# Patient Record
Sex: Male | Born: 1970 | Race: Black or African American | Hispanic: No | Marital: Single | Smoking: Never smoker
Health system: Southern US, Community
[De-identification: ages and names within clinical notes are randomized; demographics above are authoritative.]

## PROBLEM LIST (undated history)

## (undated) DIAGNOSIS — E119 Type 2 diabetes mellitus without complications: Secondary | ICD-10-CM

## (undated) DIAGNOSIS — E1142 Type 2 diabetes mellitus with diabetic polyneuropathy: Secondary | ICD-10-CM

## (undated) DIAGNOSIS — E11610 Type 2 diabetes mellitus with diabetic neuropathic arthropathy: Secondary | ICD-10-CM

## (undated) DIAGNOSIS — I10 Essential (primary) hypertension: Secondary | ICD-10-CM

## (undated) DIAGNOSIS — E11319 Type 2 diabetes mellitus with unspecified diabetic retinopathy without macular edema: Secondary | ICD-10-CM

## (undated) DIAGNOSIS — M869 Osteomyelitis, unspecified: Secondary | ICD-10-CM

## (undated) HISTORY — PX: CARDIAC CATHETERIZATION: SHX172

## (undated) HISTORY — DX: Type 2 diabetes mellitus with unspecified diabetic retinopathy without macular edema: E11.319

## (undated) HISTORY — PX: OTHER SURGICAL HISTORY: SHX169

## (undated) HISTORY — DX: Osteomyelitis, unspecified: M86.9

---

## 1998-05-02 ENCOUNTER — Emergency Department (HOSPITAL_COMMUNITY): Admission: EM | Admit: 1998-05-02 | Discharge: 1998-05-02 | Payer: Self-pay | Admitting: Emergency Medicine

## 1998-10-21 ENCOUNTER — Emergency Department (HOSPITAL_COMMUNITY): Admission: EM | Admit: 1998-10-21 | Discharge: 1998-10-21 | Payer: Self-pay

## 1999-04-09 ENCOUNTER — Emergency Department (HOSPITAL_COMMUNITY): Admission: EM | Admit: 1999-04-09 | Discharge: 1999-04-09 | Payer: Self-pay | Admitting: Emergency Medicine

## 2000-03-19 ENCOUNTER — Emergency Department (HOSPITAL_COMMUNITY): Admission: EM | Admit: 2000-03-19 | Discharge: 2000-03-19 | Payer: Self-pay

## 2000-03-24 ENCOUNTER — Ambulatory Visit (HOSPITAL_COMMUNITY): Admission: RE | Admit: 2000-03-24 | Discharge: 2000-03-24 | Payer: Self-pay

## 2000-05-05 ENCOUNTER — Encounter: Admission: RE | Admit: 2000-05-05 | Discharge: 2000-08-03 | Payer: Self-pay | Admitting: Internal Medicine

## 2002-05-09 ENCOUNTER — Emergency Department (HOSPITAL_COMMUNITY): Admission: EM | Admit: 2002-05-09 | Discharge: 2002-05-09 | Payer: Self-pay | Admitting: Emergency Medicine

## 2004-08-09 ENCOUNTER — Ambulatory Visit: Payer: Self-pay | Admitting: Internal Medicine

## 2004-08-12 ENCOUNTER — Ambulatory Visit: Payer: Self-pay | Admitting: Internal Medicine

## 2004-08-22 ENCOUNTER — Ambulatory Visit: Payer: Self-pay | Admitting: Internal Medicine

## 2005-12-03 ENCOUNTER — Ambulatory Visit: Payer: Self-pay | Admitting: Internal Medicine

## 2005-12-31 ENCOUNTER — Ambulatory Visit: Payer: Self-pay | Admitting: Endocrinology

## 2006-01-21 ENCOUNTER — Ambulatory Visit: Payer: Self-pay | Admitting: Endocrinology

## 2006-11-06 DIAGNOSIS — E1169 Type 2 diabetes mellitus with other specified complication: Secondary | ICD-10-CM

## 2006-11-06 DIAGNOSIS — E669 Obesity, unspecified: Secondary | ICD-10-CM | POA: Insufficient documentation

## 2007-02-24 ENCOUNTER — Encounter: Payer: Self-pay | Admitting: Endocrinology

## 2007-02-24 DIAGNOSIS — I10 Essential (primary) hypertension: Secondary | ICD-10-CM | POA: Insufficient documentation

## 2007-07-15 ENCOUNTER — Telehealth (INDEPENDENT_AMBULATORY_CARE_PROVIDER_SITE_OTHER): Payer: Self-pay | Admitting: *Deleted

## 2007-09-08 ENCOUNTER — Ambulatory Visit: Payer: Self-pay | Admitting: Internal Medicine

## 2007-09-08 DIAGNOSIS — E8881 Metabolic syndrome: Secondary | ICD-10-CM

## 2007-09-12 LAB — CONVERTED CEMR LAB
ALT: 40 units/L (ref 0–53)
AST: 25 units/L (ref 0–37)
Albumin: 3.6 g/dL (ref 3.5–5.2)
Alkaline Phosphatase: 67 units/L (ref 39–117)
BUN: 11 mg/dL (ref 6–23)
Basophils Absolute: 0.1 10*3/uL (ref 0.0–0.1)
Basophils Relative: 0.6 % (ref 0.0–1.0)
Bilirubin, Direct: 0.1 mg/dL (ref 0.0–0.3)
CO2: 29 meq/L (ref 19–32)
Calcium: 9.5 mg/dL (ref 8.4–10.5)
Chloride: 106 meq/L (ref 96–112)
Cholesterol: 129 mg/dL (ref 0–200)
Creatinine, Ser: 1 mg/dL (ref 0.4–1.5)
Creatinine,U: 278.7 mg/dL
Eosinophils Absolute: 0.2 10*3/uL (ref 0.0–0.6)
Eosinophils Relative: 2.1 % (ref 0.0–5.0)
GFR calc Af Amer: 107 mL/min
GFR calc non Af Amer: 88 mL/min
Glucose, Bld: 139 mg/dL — ABNORMAL HIGH (ref 70–99)
HCT: 43.4 % (ref 39.0–52.0)
HDL: 20.4 mg/dL — ABNORMAL LOW (ref >39.0)
Hemoglobin: 14.5 g/dL (ref 13.0–17.0)
Hgb A1c MFr Bld: 7.4 % — ABNORMAL HIGH (ref 4.6–6.0)
LDL Cholesterol: 84 mg/dL (ref 0–99)
Lymphocytes Relative: 22 % (ref 12.0–46.0)
MCHC: 33.5 g/dL (ref 30.0–36.0)
MCV: 89.4 fL (ref 78.0–100.0)
Microalb Creat Ratio: 7.5 mg/g (ref 0.0–30.0)
Microalb, Ur: 2.1 mg/dL — ABNORMAL HIGH (ref 0.0–1.9)
Monocytes Absolute: 1.2 10*3/uL — ABNORMAL HIGH (ref 0.2–0.7)
Monocytes Relative: 10.9 % (ref 3.0–11.0)
Neutro Abs: 6.8 10*3/uL (ref 1.4–7.7)
Neutrophils Relative %: 64.4 % (ref 43.0–77.0)
Platelets: 256 10*3/uL (ref 150–400)
Potassium: 4.4 meq/L (ref 3.5–5.1)
RBC: 4.85 M/uL (ref 4.22–5.81)
RDW: 12.3 % (ref 11.5–14.6)
Sodium: 141 meq/L (ref 135–145)
TSH: 1.11 microintl units/mL (ref 0.35–5.50)
Total Bilirubin: 0.6 mg/dL (ref 0.3–1.2)
Total CHOL/HDL Ratio: 6.3
Total Protein: 8.1 g/dL (ref 6.0–8.3)
Triglycerides: 122 mg/dL (ref 0–149)
VLDL: 24 mg/dL (ref 0–40)
WBC: 10.6 10*3/uL — ABNORMAL HIGH (ref 4.5–10.5)

## 2007-09-13 ENCOUNTER — Encounter (INDEPENDENT_AMBULATORY_CARE_PROVIDER_SITE_OTHER): Payer: Self-pay | Admitting: *Deleted

## 2007-09-16 ENCOUNTER — Telehealth (INDEPENDENT_AMBULATORY_CARE_PROVIDER_SITE_OTHER): Payer: Self-pay | Admitting: *Deleted

## 2008-10-25 ENCOUNTER — Encounter (INDEPENDENT_AMBULATORY_CARE_PROVIDER_SITE_OTHER): Payer: Self-pay | Admitting: *Deleted

## 2010-04-05 ENCOUNTER — Encounter: Payer: Self-pay | Admitting: Internal Medicine

## 2010-07-23 ENCOUNTER — Ambulatory Visit: Admit: 2010-07-23 | Payer: Self-pay | Admitting: Internal Medicine

## 2010-07-23 NOTE — Letter (Signed)
Summary: Discharge Letter  [facility]  [address] [place], [place] [address]   Phone: [phone]  Fax: [phone]       04/05/2010 MRN: [id number]  [facility] [address] [place], [place]  [address]  Dear Mr. GLADISH,   I find it necessary to inform you that I will not be able to provide medical care to you, because of non-compliance and your failure to come to scheduled appointments despite having uncontrolled diabetes.  Since your condition requires medical attention, I suggest that you place your self under the care of another physician without delay. If you desire, I will be available for emergency care for 30 days after you receive this letter.  This should give you ample time to select a physician of your choice from the many competent providers in this area. You may want to call the local medical society or [facility]'s physician referral service [phone]) for their assistance in locating a new physician. With your written authorization, I will make a copy of your medical record available to your new physician.   Sincerely,   Marga Melnick, MD [place]

## 2010-09-30 ENCOUNTER — Other Ambulatory Visit: Payer: Self-pay | Admitting: Internal Medicine

## 2010-09-30 NOTE — Telephone Encounter (Signed)
I called pharmacy and left message on voicemail, patient is no longer under Dr.Hopper's care per Discharge letter from 04/05/2010

## 2010-10-06 ENCOUNTER — Other Ambulatory Visit: Payer: Self-pay | Admitting: Internal Medicine

## 2010-11-08 NOTE — Consult Note (Signed)
[facility]                            ENDOCRINOLOGY CONSULTATION   NAME:Randall Morton, Bail                          MRN:          [id number]  DATE:12/31/2005                            DOB:          07/06/1970    REFERRING PHYSICIAN:  Dr. Marga Melnick.   REASON FOR REFERRAL:  Diabetes.   HISTORY OF PRESENT ILLNESS:  This 40 year old man who reports a 5-year  history of diabetes.  He is unaware of any chronic complications of  diabetes.  He currently takes only metformin, and states his glucoses have  been elevated.  Symptomatically, he has polyuria and polydipsia for the past  few months and a slight associated numbness of his feet.   PAST MEDICAL HISTORY:  Hypertension.   MEDICATIONS:  1.  Metformin 1000 mg twice a day.  2.  He also takes lisinopril 20 mg a day.   SOCIAL HISTORY:  He is a Biomedical scientist.  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   FAMILY HISTORY:  Positive for diabetes in his father, who is deceased, who  took insulin for his diabetes.   REVIEW OF SYSTEMS:  Denies any change in his weight.  He denies chest pain,  shortness of breath, nausea and vomiting.   PHYSICAL EXAMINATION:  Blood pressure 145/98, heart rate 94, temperature is  99.3, the weight is 340 pounds.  GENERAL:  Obese, in no distress.  SKIN:  Not diaphoretic. I do not see a rash.  HEENT:  No proptosis, no periorbital swelling.  PHARYNX:  No erythema.  NECK:  No goiter.  CHEST:  Clear to auscultation, no respiratory distress.  CARDIOVASCULAR:  No JVD, no edema, regular rate and rhythm, no murmur.  Pedal pulses are intact, and there is no bruit at the carotid arteries.  FEET:  Normal color and temperature.  There is no ulcer present on the feet.  NEUROLOGICAL:  Alert, well oriented, does not appear anxious nor depressed,  and sensation is intact to touch on the  feet.   LABORATORY STUDIES:  Forwarded by Dr. Alwyn Ren on December 03, 2005.  LDL  cholesterol 84, triglycerides 376, HDL 25.  Hemoglobin A1C 10.7, TSH normal.   IMPRESSION:  1.  He needs an increase in therapy for his type 2 diabetes.  2.  Hypertension is noted.  3.  Dyslipidemia.  4.  Numbness of the feet, which probably is due to his hyperglycemia.   PLAN:  1.  We discussed the risk to his health from his diabetes, and his other      risk factors.  He is advised to make an appointment back with Dr.      Alwyn Ren, specifically to optimize his risk factor control.  2.  Check vitamin B12.  3.  Aspirin 81 mg a day.  4.  Add Actos 45 mg a day and Glyburide 10 mg twice daily.  5.  Check glucose daily, varying the time of day among a.c. and h.s.  6.  Return in about three weeks.  7.  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                                   Sean A. Everardo All, MD   SAE/MedQ  DD:  01/01/2006  DT:  01/02/2006  Job #:  [id number]   cc:   Titus Dubin. Alwyn Ren, MD, FCCP

## 2010-12-11 ENCOUNTER — Other Ambulatory Visit: Payer: Self-pay | Admitting: Internal Medicine

## 2011-02-08 ENCOUNTER — Other Ambulatory Visit: Payer: Self-pay | Admitting: Internal Medicine

## 2012-03-24 ENCOUNTER — Ambulatory Visit: Payer: Self-pay | Admitting: Family Medicine

## 2012-03-24 VITALS — BP 158/103 | HR 82 | Temp 98.2°F | Resp 18 | Ht 73.0 in | Wt 336.0 lb

## 2012-03-24 DIAGNOSIS — I10 Essential (primary) hypertension: Secondary | ICD-10-CM

## 2012-03-24 DIAGNOSIS — Z0289 Encounter for other administrative examinations: Secondary | ICD-10-CM

## 2012-03-24 DIAGNOSIS — E119 Type 2 diabetes mellitus without complications: Secondary | ICD-10-CM

## 2012-03-24 LAB — POCT GLYCOSYLATED HEMOGLOBIN (HGB A1C): Hemoglobin A1C: 9.5

## 2012-03-24 MED ORDER — GUAIFENESIN-CODEINE 100-10 MG/5ML PO SYRP: 5.0000 mL | ORAL_SOLUTION | Freq: Three times a day (TID) | ORAL | Status: DC | PRN

## 2012-03-24 MED ORDER — LISINOPRIL 20 MG PO TABS: 20.0000 mg | ORAL_TABLET | Freq: Every day | ORAL | Status: DC

## 2012-03-24 MED ORDER — GLYBURIDE 5 MG PO TABS: 5.0000 mg | ORAL_TABLET | Freq: Two times a day (BID) | ORAL | Status: DC

## 2012-03-24 MED ORDER — METFORMIN HCL 1000 MG PO TABS: 1000.0000 mg | ORAL_TABLET | Freq: Two times a day (BID) | ORAL | Status: DC

## 2012-03-24 NOTE — Progress Notes (Signed)
Subjective:    Patient ID: Randall Morton, male    DOB: 1970/12/10, 41 y.o.   MRN: [id number] Chief Complaint  Patient presents with  . 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  He is not fasting and does not want any lab work done due to self-pay.  He also wants a refill on his DM and HTN meds which he has been out of for quite some time.  He does not currently have a PCP - looks like Dr. Alwyn Ren (with [facility]) discharged the pt from his practice due to non-compliance and no-shows. He is not checking his cbgs. No hypoglycemic episodes  No past medical history on file. No past surgical history on file. No current outpatient prescriptions on file prior to visit.   No current facility-administered medications on file prior to visit.   No Known Allergies History   Social History  . Marital Status: Single    Spouse Name: N/A    Number of Children: N/A  . Years of Education: N/A   Social History Main Topics  . Smoking status: Never Smoker   . Smokeless tobacco: None  . Alcohol Use: None  . Drug Use: None  . Sexually Active: None   Other Topics Concern  . None   Social History Narrative  . None   No family history on file.  Review of Systems  Respiratory: Positive for cough.   All other systems reviewed and are negative.      BP 158/103  Pulse 82  Temp(Src) 98.2 F (36.8 C) (Oral)  Resp 18  Ht 6\' 1"  (1.854 m)  Wt 336 lb (152.409 kg)  BMI 44.34 kg/m2 Objective:   Physical Exam  Constitutional: He is oriented to person, place, and time. He appears well-developed and well-nourished. No distress.  Morbidly obese  HENT:  Head: Normocephalic and atraumatic.  Right Ear: Tympanic membrane, external ear and ear canal normal.  Left Ear: Tympanic membrane, external ear and ear canal normal.  Nose: Nose normal.  Mouth/Throat: Uvula is midline, oropharynx is clear and moist and mucous membranes are normal. No  oropharyngeal exudate.  Eyes: Conjunctivae are normal. Right eye exhibits no discharge. Left eye exhibits no discharge. No scleral icterus.  Neck: Normal range of motion. Neck supple. No thyromegaly present.  Cardiovascular: Normal rate, regular rhythm, normal heart sounds and intact distal pulses.   Pulmonary/Chest: Effort normal and breath sounds normal. No respiratory distress.  Abdominal: Soft. Bowel sounds are normal. He exhibits no distension and no mass. There is no tenderness. There is no rebound and no guarding. Hernia confirmed negative in the right inguinal area and confirmed negative in the left inguinal area.  Musculoskeletal: He exhibits no edema.  Lymphadenopathy:    He has no cervical adenopathy.  Neurological: He is alert and oriented to person, place, and time. He has normal reflexes. No cranial nerve deficit. He exhibits normal muscle tone.  Skin: Skin is warm and dry. No rash noted. He is not diaphoretic. No erythema.  Psychiatric: He has a normal mood and affect. His behavior is normal.      Results for orders placed in visit on 03/24/12  POCT GLYCOSYLATED HEMOGLOBIN (HGB A1C)      Component Value Range   Hemoglobin A1C 9.5      Assessment & Plan:  Diabetes mellitus - Plan: POCT glycosylated hemoglobin (Hb A1C), Basic metabolic panel -  restart metformin and glyburide - have to check bmp to ensure ok to continue metformin as with uncontrolled DM and HTN he is a set up for kidney failure.  Unspecified essential hypertension - restart lisinopril  Health examination of defined subpopulation - 3 mo card given but pt has to have his BP <140/90 and shown compliance with his DM meds for renewal at next visit.  Meds ordered this encounter  Medications  . glyBURIDE (DIABETA) 5 MG tablet    Sig: Take 1 tablet (5 mg total) by mouth 2 (two) times daily with a meal.    Dispense:  60 tablet    Refill:  3  . lisinopril (PRINIVIL,ZESTRIL) 20 MG tablet    Sig: Take 1 tablet (20  mg total) by mouth daily.    Dispense:  30 tablet    Refill:  3  . metFORMIN (GLUCOPHAGE) 1000 MG tablet    Sig: Take 1 tablet (1,000 mg total) by mouth 2 (two) times daily with a meal.    Dispense:  60 tablet    Refill:  3  . guaiFENesin-codeine (ROBITUSSIN AC) 100-10 MG/5ML syrup    Sig: Take 5 mLs by mouth 3 (three) times daily as needed for cough.    Dispense:  120 mL    Refill:  0    Diabetic formulation if avail.

## 2012-03-25 LAB — BASIC METABOLIC PANEL
CO2: 26 mEq/L (ref 19–32)
Glucose, Bld: 276 mg/dL — ABNORMAL HIGH (ref 70–99)
Potassium: 4.3 mEq/L (ref 3.5–5.3)
Sodium: 136 mEq/L (ref 135–145)

## 2012-08-13 ENCOUNTER — Ambulatory Visit: Payer: Self-pay | Admitting: Family Medicine

## 2012-09-10 ENCOUNTER — Ambulatory Visit: Payer: Self-pay | Admitting: Family Medicine

## 2013-03-14 ENCOUNTER — Emergency Department (HOSPITAL_COMMUNITY)
Admission: EM | Admit: 2013-03-14 | Discharge: 2013-03-14 | Disposition: A | Discharge status: Home / Self Care | Payer: Self-pay | Attending: Emergency Medicine | Admitting: Emergency Medicine

## 2013-03-14 ENCOUNTER — Encounter (HOSPITAL_COMMUNITY): Payer: Self-pay | Admitting: Emergency Medicine

## 2013-03-14 DIAGNOSIS — X58XXXA Exposure to other specified factors, initial encounter: Secondary | ICD-10-CM | POA: Insufficient documentation

## 2013-03-14 DIAGNOSIS — Y939 Activity, unspecified: Secondary | ICD-10-CM | POA: Insufficient documentation

## 2013-03-14 DIAGNOSIS — L988 Other specified disorders of the skin and subcutaneous tissue: Secondary | ICD-10-CM | POA: Insufficient documentation

## 2013-03-14 DIAGNOSIS — IMO0002 Reserved for concepts with insufficient information to code with codable children: Secondary | ICD-10-CM | POA: Insufficient documentation

## 2013-03-14 DIAGNOSIS — E119 Type 2 diabetes mellitus without complications: Secondary | ICD-10-CM | POA: Insufficient documentation

## 2013-03-14 DIAGNOSIS — Z79899 Other long term (current) drug therapy: Secondary | ICD-10-CM | POA: Insufficient documentation

## 2013-03-14 DIAGNOSIS — Z791 Long term (current) use of non-steroidal anti-inflammatories (NSAID): Secondary | ICD-10-CM | POA: Insufficient documentation

## 2013-03-14 DIAGNOSIS — T148XXA Other injury of unspecified body region, initial encounter: Secondary | ICD-10-CM

## 2013-03-14 DIAGNOSIS — S76219A Strain of adductor muscle, fascia and tendon of unspecified thigh, initial encounter: Secondary | ICD-10-CM

## 2013-03-14 DIAGNOSIS — I10 Essential (primary) hypertension: Secondary | ICD-10-CM | POA: Insufficient documentation

## 2013-03-14 DIAGNOSIS — Y929 Unspecified place or not applicable: Secondary | ICD-10-CM | POA: Insufficient documentation

## 2013-03-14 HISTORY — DX: Type 2 diabetes mellitus without complications: E11.9

## 2013-03-14 HISTORY — DX: Essential (primary) hypertension: I10

## 2013-03-14 MED ORDER — GABAPENTIN 100 MG PO CAPS: 100.0000 mg | ORAL_CAPSULE | Freq: Three times a day (TID) | ORAL | Status: DC

## 2013-03-14 NOTE — ED Provider Notes (Signed)
CSN: [id number]     Arrival date & time 03/14/13  1615 History   First MD Initiated Contact with Patient 03/14/13 1729     Chief Complaint  Patient presents with  . Leg Pain  . Blisters on feet    (Consider location/radiation/quality/duration/timing/severity/associated sxs/prior Treatment) HPI Comments: Patient with a history of HTN and DM presents today with a chief complaint of blisters on his feet and also pain of the right groin area.  He reports that the blisters have been present on his feet for the past week.  Blisters present on the great toe bilaterally.  He reports that he wears steel toe boots to work which rub against his toe.  He feels that this may be causing the blisters.  He has not noticed any drainage from the area.  No surrounding erythema or edema.  No fever or chills.  He does report a tingling sensation of both of his feet that has been present for the past year.  He has a history of DM.  He reports that he is compliant with his diabetes medications.  Patient is also complaining of pain to the right groin area.  He states that the pain has been present intermittently over the past year.  He describes the pain as a "pulling pain."  He states that the pain is worse after sitting for a long time.  Pain is also worse with abduction of the hip.  He states that he recently lifted weights with his legs and had to stop because this also made the pain worse.  Denies any scrotal pain, swelling, hematuria, flank pain, or urinary symptoms.  He reports that he takes Ibuprofen for the pain, which does help.  He denies any acute injury or trauma to the area.    The history is provided by the patient.    Past Medical History  Diagnosis Date  . Diabetes mellitus without complication   . Hypertension    History reviewed. No pertinent past surgical history. No family history on file. History  Substance Use Topics  . Smoking status: Never Smoker   . Smokeless tobacco: Not on file  .  Alcohol Use: Yes     Comment: occasionallly    Review of Systems  Musculoskeletal:       Right groin pain  Skin:       Blisters of both feet    Allergies  Review of patient's allergies indicates no known allergies.  Home Medications   Current Outpatient Rx  Name  Route  Sig  Dispense  Refill  . glyBURIDE (DIABETA) 5 MG tablet   Oral   Take 1 tablet (5 mg total) by mouth 2 (two) times daily with a meal.   60 tablet   3   . HYDROcodone-acetaminophen (NORCO/VICODIN) 5-325 MG per tablet   Oral   Take 1 tablet by mouth once.         [name] ibuprofen (ADVIL,MOTRIN) 200 MG tablet   Oral   Take 200 mg by mouth every 6 (six) hours as needed for pain.         [name] lisinopril (PRINIVIL,ZESTRIL) 20 MG tablet   Oral   Take 1 tablet (20 mg total) by mouth daily.   30 tablet   3   . metFORMIN (GLUCOPHAGE) 1000 MG tablet   Oral   Take 1 tablet (1,000 mg total) by mouth 2 (two) times daily with a meal.   60 tablet   3   . naproxen  sodium (ANAPROX) 220 MG tablet   Oral   Take 220 mg by mouth 2 (two) times daily with a meal.          BP 138/89  Pulse 109  Temp(Src) 98.4 F (36.9 C) (Oral)  Resp 22  SpO2 98% Physical Exam  Nursing note and vitals reviewed. Constitutional: He appears well-developed and well-nourished.  HENT:  Head: Normocephalic and atraumatic.  Neck: Normal range of motion. Neck supple.  Cardiovascular: Normal rate, regular rhythm and normal heart sounds.   Pulses:      Dorsalis pedis pulses are 2+ on the right side, and 2+ on the left side.  Pulmonary/Chest: Effort normal and breath sounds normal.  Musculoskeletal: Normal range of motion.       Right hip: He exhibits normal range of motion, no bony tenderness, no swelling and no deformity.  Increased pain of the right groin area with abduction of the hip.  No edema or erythema visualized.  No warmth of the area.  Neurological: He is alert.  Distal sensation of both feet intact  Skin: Skin is warm and  dry.  Patient with a small pressure blister to the medial left great toe.  Two small blisters to the right great toe.  Blisters intact.  No drainage.  No surrounding erythema, edema, or warmth.  Psychiatric: He has a normal mood and affect.    ED Course  Procedures (including critical care time) Labs Review Labs Reviewed - No data to display Imaging Review No results found.  MDM  No diagnosis found. Patient presenting with blisters of the great toe bilaterally.  Blisters likely from friction due to his steel toe boots.  No signs of infection.  No ulceration.  Sensation of the foot intact.  Patient also complaining of intermittent right groin pain over the past year.  He reports that he gets the pain when lifting weight with his legs.  Pain worse with abduction of the hip.  No signs of infection.  No erythema or edema of the area.  Distal pulses intact.  Suspect that the pain is muscle strain.  Patient instructed to do gentle stretching of the area and to take NSAIDs as needed.  Patient also instructed to follow up with PCP.      Pascal Lux [place]oth, PA-C 03/15/13 [phone]

## 2013-03-14 NOTE — ED Notes (Signed)
Pt states that he has had tightness in his L leg x several months. 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 Also c/o blisters on his feet. DM2. Feet are numb per normal. Alert and Oriented. No acute distress.

## 2013-03-16 NOTE — ED Provider Notes (Signed)
Medical screening examination/treatment/procedure(s) were performed by non-physician practitioner and as supervising physician I was immediately available for consultation/collaboration.  Shon Baton, MD 03/16/13 1410

## 2013-08-26 ENCOUNTER — Encounter: Payer: Self-pay | Admitting: Family Medicine

## 2013-08-26 ENCOUNTER — Ambulatory Visit (INDEPENDENT_AMBULATORY_CARE_PROVIDER_SITE_OTHER): Payer: BLUE CROSS/BLUE SHIELD | Admitting: Family Medicine

## 2013-08-26 VITALS — BP 143/85 | HR 109 | Temp 98.9°F | Resp 18 | Ht 72.5 in | Wt 311.0 lb

## 2013-08-26 DIAGNOSIS — Z0000 Encounter for general adult medical examination without abnormal findings: Secondary | ICD-10-CM | POA: Diagnosis not present

## 2013-08-26 DIAGNOSIS — E118 Type 2 diabetes mellitus with unspecified complications: Secondary | ICD-10-CM | POA: Insufficient documentation

## 2013-08-26 DIAGNOSIS — E1165 Type 2 diabetes mellitus with hyperglycemia: Secondary | ICD-10-CM

## 2013-08-26 DIAGNOSIS — I10 Essential (primary) hypertension: Secondary | ICD-10-CM | POA: Insufficient documentation

## 2013-08-26 DIAGNOSIS — IMO0001 Reserved for inherently not codable concepts without codable children: Secondary | ICD-10-CM

## 2013-08-26 DIAGNOSIS — E1340 Other specified diabetes mellitus with diabetic neuropathy, unspecified: Secondary | ICD-10-CM | POA: Insufficient documentation

## 2013-08-26 DIAGNOSIS — IMO0002 Reserved for concepts with insufficient information to code with codable children: Secondary | ICD-10-CM

## 2013-08-26 DIAGNOSIS — E1349 Other specified diabetes mellitus with other diabetic neurological complication: Secondary | ICD-10-CM | POA: Diagnosis not present

## 2013-08-26 LAB — CBC WITH DIFFERENTIAL/PLATELET
Basophils Absolute: 0 10*3/uL (ref 0.0–0.1)
Basophils Relative: 0 % (ref 0–1)
Eosinophils Absolute: 0.1 10*3/uL (ref 0.0–0.7)
Eosinophils Relative: 1 % (ref 0–5)
HCT: 45.8 % (ref 39.0–52.0)
Hemoglobin: 16.1 g/dL (ref 13.0–17.0)
Lymphocytes Relative: 37 % (ref 12–46)
Lymphs Abs: 4.6 10*3/uL — ABNORMAL HIGH (ref 0.7–4.0)
MCH: 30.6 pg (ref 26.0–34.0)
MCHC: 35.2 g/dL (ref 30.0–36.0)
MCV: 86.9 fL (ref 78.0–100.0)
Monocytes Absolute: 1 10*3/uL (ref 0.1–1.0)
Monocytes Relative: 8 % (ref 3–12)
Neutro Abs: 6.7 10*3/uL (ref 1.7–7.7)
Neutrophils Relative %: 54 % (ref 43–77)
Platelets: 296 10*3/uL (ref 150–400)
RBC: 5.27 MIL/uL (ref 4.22–5.81)
RDW: 13.6 % (ref 11.5–15.5)
WBC: 12.4 10*3/uL — ABNORMAL HIGH (ref 4.0–10.5)

## 2013-08-26 LAB — COMPLETE METABOLIC PANEL WITH GFR
Alkaline Phosphatase: 73 U/L (ref 39–117)
BUN: 10 mg/dL (ref 6–23)
CO2: 27 mEq/L (ref 19–32)
Calcium: 9.9 mg/dL (ref 8.4–10.5)
Chloride: 101 mEq/L (ref 96–112)
Creat: 0.97 mg/dL (ref 0.50–1.35)
GFR, Est African American: >89 mL/min
GFR, Est Non African American: >89 mL/min
Glucose, Bld: 228 mg/dL — ABNORMAL HIGH (ref 70–99)
Total Bilirubin: 0.7 mg/dL (ref 0.2–1.2)
Total Protein: 7.9 g/dL (ref 6.0–8.3)

## 2013-08-26 LAB — COMPLETE METABOLIC PANEL WITHOUT GFR
ALT: 21 U/L (ref 0–53)
AST: 17 U/L (ref 0–37)
Albumin: 4.2 g/dL (ref 3.5–5.2)
Potassium: 4.1 meq/L (ref 3.5–5.3)
Sodium: 138 meq/L (ref 135–145)

## 2013-08-26 LAB — LDL CHOLESTEROL, DIRECT: Direct LDL: 91 mg/dL

## 2013-08-26 LAB — IFOBT (OCCULT BLOOD): IFOBT: NEGATIVE

## 2013-08-26 LAB — POCT GLYCOSYLATED HEMOGLOBIN (HGB A1C): Hemoglobin A1C: 10.4

## 2013-08-26 LAB — GLUCOSE, POCT (MANUAL RESULT ENTRY): POC Glucose: 234 mg/dL — AB (ref 70–99)

## 2013-08-26 MED ORDER — GLYBURIDE 5 MG PO TABS: 5.0000 mg | ORAL_TABLET | Freq: Two times a day (BID) | ORAL | Status: DC

## 2013-08-26 MED ORDER — METFORMIN HCL 1000 MG PO TABS: 1000.0000 mg | ORAL_TABLET | Freq: Two times a day (BID) | ORAL | Status: DC

## 2013-08-26 MED ORDER — GABAPENTIN 100 MG PO CAPS: 100.0000 mg | ORAL_CAPSULE | Freq: Every day | ORAL | Status: DC

## 2013-08-26 MED ORDER — LISINOPRIL 10 MG PO TABS: 10.0000 mg | ORAL_TABLET | Freq: Every day | ORAL | Status: DC

## 2013-08-26 NOTE — Progress Notes (Signed)
Chief Complaint:  Chief Complaint  Patient presents with  . Annual Exam    HPI: Randall Morton is a 43 y.o. male who is here for annual visit.  HTN is better-lost weight, about 25 lbs His DM is not well ocntroll, he has neuropathy--he just got insurance so thatw as the reason He is UTD on flu vaccine. GF with history of prostate cancer He works for [employer] He has been out of all 3 of his medicines, no low sugars, hypoglycemia.  Last time he took his glyburide and the metformin---at least 5 months He has been exercising and working out---lost 20 lbs He gets his blood sugar at work and the fasting blood gluocose 247-230s. HE does not check it at home Last time he took his blood work was a while ago He is utd on eye exam, had to see optometrist for bifocals   Past Medical History  Diagnosis Date  . Diabetes mellitus without complication   . Hypertension    History reviewed. No pertinent past surgical history. History   Social History  . Marital Status: Single    Spouse Name: N/A    Number of Children: N/A  . Years of Education: N/A   Social History Main Topics  . Smoking status: Never Smoker   . Smokeless tobacco: None  . Alcohol Use: Yes     Comment: occasionallly  . Drug Use: No  . Sexual Activity: None   Other Topics Concern  . None   Social History Narrative  . None   Family History  Problem Relation Age of Onset  . Asthma Mother   . Diabetes Father   . Heart disease Father   . Hyperlipidemia Father   . Hypertension Father   . Stroke Father   . Prostate cancer Paternal Grandfather    No Known Allergies Prior to Admission medications   Medication Sig Start Date End Date Taking? Authorizing Provider  glyBURIDE (DIABETA) 5 MG tablet Take 1 tablet (5 mg total) by mouth 2 (two) times daily with a meal. 03/24/12  Yes Sherren MochaEva N Shaw, MD  lisinopril (PRINIVIL,ZESTRIL) 20 MG tablet Take 1 tablet (20 mg total) by mouth daily. 03/24/12  Yes Sherren MochaEva N  Shaw, MD  metFORMIN (GLUCOPHAGE) 1000 MG tablet Take 1 tablet (1,000 mg total) by mouth 2 (two) times daily with a meal. 03/24/12  Yes Sherren MochaEva N Shaw, MD  gabapentin (NEURONTIN) 100 MG capsule Take 1 capsule (100 mg total) by mouth 3 (three) times daily. 03/14/13   Santiago GladHeather Laisure, PA-C  HYDROcodone-acetaminophen (NORCO/VICODIN) 5-325 MG per tablet Take 1 tablet by mouth once.    Historical Provider, MD  ibuprofen (ADVIL,MOTRIN) 200 MG tablet Take 200 mg by mouth every 6 (six) hours as needed for pain.    Historical Provider, MD  naproxen sodium (ANAPROX) 220 MG tablet Take 220 mg by mouth 2 (two) times daily with a meal.    Historical Provider, MD     ROS: The patient denies fevers, chills, night sweats, unintentional weight loss, chest pain, palpitations, wheezing, dyspnea on exertion, nausea, vomiting, abdominal pain, dysuria, hematuria, melena,  All other systems have been reviewed and were otherwise negative with the exception of those mentioned in the HPI and as above.    PHYSICAL EXAM: Filed Vitals:   08/26/13 1620  BP: 143/85  Pulse: 109  Temp: 98.9 F (37.2 C)  Resp: 18   Filed Vitals:   08/26/13 1620  Height: 6' 0.5" (1.842 m)  Weight: 311 lb (141.069 kg)   Body mass index is 41.58 kg/(m^2).  General: Alert, no acute distress HEENT:  Normocephalic, atraumatic, oropharynx patent. EOMI, PERRLA, fundo exam nl Cardiovascular:  Regular rate and rhythm, no rubs murmurs or gallops.  No Carotid bruits, radial pulse intact. No pedal edema.  Respiratory: Clear to auscultation bilaterally.  No wheezes, rales, or rhonchi.  No cyanosis, no use of accessory musculature GI: No organomegaly, abdomen is soft and non-tender, positive bowel sounds.  No masses. Skin: No rashes. Neurologic: Facial musculature symmetric. Microfilament exam normal Psychiatric: Patient is appropriate throughout our interaction. Lymphatic: No cervical lymphadenopathy Musculoskeletal: Gait intact. GU exam nl-prostate  and rectal exam nl   LABS: Results for orders placed in visit on 08/26/13  GLUCOSE, POCT (MANUAL RESULT ENTRY)      Result Value Ref Range   POC Glucose 234 (*) 70 - 99 mg/dl  IFOBT (OCCULT BLOOD)      Result Value Ref Range   IFOBT Negative       EKG/XRAY:   Primary read interpreted by Dr. Conley Rolls at [facility].   ASSESSMENT/PLAN: Encounter Diagnoses  Name Primary?  . Annual physical exam Yes  . Diabetes mellitus type II, uncontrolled   . HTN (hypertension)    Decrease Lisinopril from 20 to 10 mg since BP is better after 25-30 lb weightloss DM is not well controlled , he has neuropathy Will continue with metfromin and glyburide, check fasting sugars, decrease gabapentin becuas ehe did not know if it was workign and he works 3rd shift and doe snot want to be groggy Follow-up in 28month with glucose logs Will calll regarding glucose monitor name,. Will order test strips at that time for him after he gets the type og monitor he needs He is UTD on flu Annual labs pending Recommend : ADA diet, BP goal <140/90, daily foot exams, tobacco cessation if smoking, annual eye exam, annual flu vaccine, PNA vaccine if age and time appropriate.    Gross sideeffects, risk and benefits, and alternatives of medications d/w patient. Patient is aware that all medications have potential sideeffects and we are unable to predict every sideeffect or drug-drug interaction that may occur.  Cressie Betzler PHUONG, DO 08/26/2013 5:09 PM

## 2013-08-26 NOTE — Patient Instructions (Signed)
Diabetes and Exercise Exercising regularly is important. It is not just about losing weight. It has many health benefits, such as:  Improving your overall fitness, flexibility, and endurance.  Increasing your bone density.  Helping with weight control.  Decreasing your body fat.  Increasing your muscle strength.  Reducing stress and tension.  Improving your overall health. People with diabetes who exercise gain additional benefits because exercise:  Reduces appetite.  Improves the body's use of blood sugar (glucose).  Helps lower or control blood glucose.  Decreases blood pressure.  Helps control blood lipids (such as cholesterol and triglycerides).  Improves the body's use of the hormone insulin by:  Increasing the body's insulin sensitivity.  Reducing the body's insulin needs.  Decreases the risk for heart disease because exercising:  Lowers cholesterol and triglycerides levels.  Increases the levels of good cholesterol (such as high-density lipoproteins [HDL]) in the body.  Lowers blood glucose levels. YOUR ACTIVITY PLAN  Choose an activity that you enjoy and set realistic goals. Your health care provider or diabetes educator can help you make an activity plan that works for you. You can break activities into 2 or 3 sessions throughout the day. Doing so is as good as one long session. Exercise ideas include:  Taking the dog for a walk.  Taking the stairs instead of the elevator.  Dancing to your favorite song.  Doing your favorite exercise with a friend. RECOMMENDATIONS FOR EXERCISING WITH TYPE 1 OR TYPE 2 DIABETES   Check your blood glucose before exercising. If blood glucose levels are greater than 240 mg/dL, check for urine ketones. Do not exercise if ketones are present.  Avoid injecting insulin into areas of the body that are going to be exercised. For example, avoid injecting insulin into:  The arms when playing tennis.  The legs when  jogging.  Keep a record of:  Food intake before and after you exercise.  Expected peak times of insulin action.  Blood glucose levels before and after you exercise.  The type and amount of exercise you have done.  Review your records with your health care provider. Your health care provider will help you to develop guidelines for adjusting food intake and insulin amounts before and after exercising.  If you take insulin or oral hypoglycemic agents, watch for signs and symptoms of hypoglycemia. They include:  Dizziness.  Shaking.  Sweating.  Chills.  Confusion.  Drink plenty of water while you exercise to prevent dehydration or heat stroke. Body water is lost during exercise and must be replaced.  Talk to your health care provider before starting an exercise program to make sure it is safe for you. Remember, almost any type of activity is better than none. Document Released: 08/30/2003 Document Revised: 02/09/2013 Document Reviewed: 11/16/2012 Exit[employer] Patient Information 2014 [employer].  

## 2013-08-27 LAB — TSH: TSH: 2.559 u[IU]/mL (ref 0.350–4.500)

## 2013-08-27 LAB — PSA: PSA: 1.09 ng/mL (ref <=4.00)

## 2013-08-27 LAB — MICROALBUMIN, URINE: Microalb, Ur: 13.46 mg/dL — ABNORMAL HIGH (ref 0.00–1.89)

## 2013-09-01 ENCOUNTER — Telehealth: Payer: Self-pay | Admitting: Family Medicine

## 2013-09-01 NOTE — Telephone Encounter (Signed)
Spoke to patient about blood work. He has NOT picked up meds for diabetes or renal protection. 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 HE will be referred to necphorlogy onnext visit if microlabumin is not improved

## 2014-07-11 ENCOUNTER — Telehealth: Payer: Self-pay

## 2014-07-11 DIAGNOSIS — M869 Osteomyelitis, unspecified: Secondary | ICD-10-CM | POA: Insufficient documentation

## 2014-07-11 NOTE — Telephone Encounter (Signed)
Patient dropped off a history/physical form requesting Dr Conley RollsLe to fill out and fax to [phone] attn: Joni ReiningNicole.  Form was put in the nurse's box by TL at [address]. Patients call back number is [phone]

## 2014-07-12 NOTE — Telephone Encounter (Signed)
FYI Dr. Conley RollsLe  You performed his annual exam 08/26/2013.

## 2014-07-13 NOTE — Telephone Encounter (Signed)
Can you find his form, it is not in my box Dr Conley RollsLe

## 2014-07-13 NOTE — Telephone Encounter (Signed)
It was still in the nurses box. It is now in your box.

## 2014-07-16 NOTE — Telephone Encounter (Signed)
Spoke with patient, he needs to come in for an appt with me. Bring his labs and forms. He is aware he is overdue for labs and BP check.

## 2014-07-20 ENCOUNTER — Ambulatory Visit (INDEPENDENT_AMBULATORY_CARE_PROVIDER_SITE_OTHER): Payer: BLUE CROSS/BLUE SHIELD | Admitting: Family Medicine

## 2014-07-20 VITALS — BP 140/100 | HR 100 | Temp 98.1°F | Resp 18 | Ht 74.0 in | Wt 320.0 lb

## 2014-07-20 DIAGNOSIS — E1169 Type 2 diabetes mellitus with other specified complication: Secondary | ICD-10-CM

## 2014-07-20 DIAGNOSIS — I10 Essential (primary) hypertension: Secondary | ICD-10-CM

## 2014-07-20 DIAGNOSIS — E1142 Type 2 diabetes mellitus with diabetic polyneuropathy: Secondary | ICD-10-CM

## 2014-07-20 DIAGNOSIS — E11621 Type 2 diabetes mellitus with foot ulcer: Secondary | ICD-10-CM

## 2014-07-20 DIAGNOSIS — L97509 Non-pressure chronic ulcer of other part of unspecified foot with unspecified severity: Secondary | ICD-10-CM

## 2014-07-20 DIAGNOSIS — M869 Osteomyelitis, unspecified: Secondary | ICD-10-CM

## 2014-07-20 DIAGNOSIS — M9080 Osteopathy in diseases classified elsewhere, unspecified site: Secondary | ICD-10-CM

## 2014-07-20 MED ORDER — HYDROCHLOROTHIAZIDE 25 MG PO TABS
ORAL_TABLET | ORAL | Status: DC
Start: 2014-07-20 — End: 2015-12-13

## 2014-07-20 MED ORDER — GLYBURIDE 5 MG PO TABS: 5.0000 mg | ORAL_TABLET | Freq: Two times a day (BID) | ORAL | Status: DC

## 2014-07-20 MED ORDER — GABAPENTIN 100 MG PO CAPS: 100.0000 mg | ORAL_CAPSULE | Freq: Every day | ORAL | Status: DC

## 2014-07-20 MED ORDER — METFORMIN HCL 1000 MG PO TABS: 1000.0000 mg | ORAL_TABLET | Freq: Two times a day (BID) | ORAL | Status: DC

## 2014-07-20 MED ORDER — LISINOPRIL 10 MG PO TABS: 10.0000 mg | ORAL_TABLET | Freq: Every day | ORAL | Status: DC

## 2014-07-20 NOTE — Patient Instructions (Signed)
Recommend : ADA diet, BP goal <140/90, daily foot exams, tobacco cessation if smoking, annual eye exam, annual flu vaccine, PNA vaccine if age and time appropriate.  Check your blood pressure, BP goal is less than 140/90    Wt Readings from Last 3 Encounters:  07/20/14 320 lb (145.151 kg)  08/26/13 311 lb (141.069 kg)  03/24/12 336 lb (152.409 kg)

## 2014-07-20 NOTE — Progress Notes (Signed)
Chief Complaint:  Chief Complaint  Patient presents with  . Surgery Clearance    Left big toe    HPI: Randall Morton is a 44 y.o. male who is here for  HTN and Diabetes He has no Ses, he has been taking his BP meds without problems. 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 [address] 09/08/2007   CREATININE 0.97 08/26/2013     Past Medical History  Diagnosis Date  . Diabetes mellitus without complication   . Hypertension   . Diabetic retinopathy     last checked on 04/2014  . Osteomyelitis     left foot ulcer--> osteo   History reviewed. No pertinent past surgical history. History   Social History  . Marital Status: Single    Spouse Name: N/A    Number of Children: N/A  . Years of Education: N/A   Social History Main Topics  . Smoking status: Never Smoker   . Smokeless tobacco: None  . Alcohol Use: Yes     Comment: occasionallly  . Drug Use: No  . Sexual Activity: None   Other Topics Concern  . None   Social History Narrative   Family History  Problem Relation Age of Onset  . Asthma Mother   . Diabetes Father   . Heart disease Father   . Hyperlipidemia Father   . Hypertension Father   . Stroke Father   . Prostate cancer Paternal Grandfather    No Known Allergies Prior to Admission medications     Medication Sig Start Date End Date Taking? Authorizing Provider  gabapentin (NEURONTIN) 100 MG capsule Take 1 capsule (100 mg total) by mouth at bedtime. 08/26/13  Yes Thao P Le, DO  glyBURIDE (DIABETA) 5 MG tablet Take 1 tablet (5 mg total) by mouth 2 (two) times daily with a meal. 08/26/13  Yes Thao P Le, DO  ibuprofen (ADVIL,MOTRIN) 200 MG tablet Take 200 mg by mouth every 6 (six) hours as needed for pain.   Yes Historical Provider, MD  lisinopril (PRINIVIL,ZESTRIL) 10 MG tablet Take 1 tablet (10 mg total) by mouth daily. 08/26/13  Yes Thao P Le, DO  metFORMIN (GLUCOPHAGE) 1000 MG tablet Take 1 tablet (1,000 mg total) by mouth 2 (two) times daily with a meal. 08/26/13  Yes Thao P Le, DO  HYDROcodone-acetaminophen (NORCO/VICODIN) 5-325 MG per tablet Take 1 tablet by mouth once.    Historical Provider, MD     ROS: The patient denies fevers, chills, night sweats, unintentional weight loss, chest pain, palpitations, wheezing, dyspnea on exertion, nausea, vomiting, abdominal pain, dysuria, hematuria, melena, + numbness, tingling.  All other systems have been reviewed and were otherwise negative with the exception of those mentioned in the HPI and as above.  PHYSICAL EXAM: Filed Vitals:   07/20/14 1635  BP: 140/100  Pulse: 105  Temp: 98.1 F (36.7 C)  Resp: 18   Filed Vitals:   07/20/14 1635  Height: 6' 2" (1.88 m)  Weight: 320 lb (145.151 kg)   Body mass index is 41.07 kg/(m^2).  General: Alert, no acute distress HEENT:  Normocephalic, atraumatic, oropharynx patent. EOMI, PERRLA Cardiovascular:  Regular rate and rhythm, no rubs murmurs or gallops.  No Carotid bruits, radial pulse intact. No pedal edema.  Respiratory: Clear to auscultation bilaterally.  No wheezes, rales, or rhonchi.  No cyanosis, no use of accessory musculature GI: No organomegaly, abdomen is soft and non-tender, positive bowel sounds.  No masses. Skin: No rashes. Neurologic: Facial musculature symmetric. Psychiatric:  Patient is appropriate throughout our interaction. Lymphatic: No cervical lymphadenopathy Musculoskeletal: Gait intact, left foot in boot.     LABS: Results for orders placed or performed in visit on 08/26/13  Microalbumin, urine  Result Value Ref Range   Microalb, Ur 13.46 (H) 0.00 - 1.89 mg/dL  CBC with Differential  Result Value Ref Range   WBC 12.4 (H) 4.0 - 10.5 K/uL   RBC 5.27 4.22 - 5.81 MIL/uL   Hemoglobin 16.1 13.0 - 17.0 g/dL   HCT 45.8 39.0 - 52.0 %   MCV 86.9 78.0 - 100.0 fL   MCH 30.6 26.0 - 34.0 pg   MCHC 35.2 30.0 - 36.0 g/dL   RDW 13.6 11.5 - 15.5 %   Platelets 296 150 - 400 K/uL   Neutrophils Relative % 54 43 - 77 %   Neutro Abs 6.7 1.7 - 7.7 K/uL   Lymphocytes Relative 37 12 - 46 %   Lymphs Abs 4.6 (H) 0.7 - 4.0 K/uL   Monocytes Relative 8 3 - 12 %   Monocytes Absolute 1.0 0.1 - 1.0 K/uL   Eosinophils Relative 1 0 - 5 %   Eosinophils Absolute 0.1 0.0 - 0.7 K/uL   Basophils Relative 0 0 - 1 %   Basophils Absolute 0.0 0.0 - 0.1 K/uL   Smear Review Criteria for review not met   COMPLETE METABOLIC PANEL WITH GFR  Result Value Ref Range   Sodium 138 135 - 145 mEq/L   Potassium 4.1 3.5 - 5.3 mEq/L   Chloride 101 96 - 112 mEq/L   CO2 27 19 - 32 mEq/L   Glucose, Bld 228 (H) 70 - 99 mg/dL   BUN 10 6 - 23 mg/dL   Creat 0.97 0.50 - 1.35 mg/dL   Total Bilirubin 0.7 0.2 - 1.2 mg/dL   Alkaline Phosphatase 73 39 - 117 U/L   AST 17 0 - 37 U/L   ALT 21 0 - 53 U/L   Total Protein 7.9 6.0 - 8.3 g/dL   Albumin 4.2 3.5 - 5.2 g/dL   Calcium 9.9 8.4 - 10.5 mg/dL   GFR, Est African American >89 mL/min   GFR, Est Non African American >89 mL/min  TSH  Result Value Ref Range   TSH 2.559 0.350 - 4.500 uIU/mL  LDL cholesterol, direct  Result Value Ref Range   Direct LDL 91 mg/dL  PSA  Result Value Ref Range   PSA 1.09 <=4.00 ng/mL  POCT glucose (manual entry)  Result Value Ref Range   POC Glucose 234 (A) 70 - 99 mg/dl  POCT glycosylated hemoglobin (Hb A1C)  Result  Value Ref Range   Hemoglobin A1C 10.4   IFOBT POC (occult bld, rslt in office)  Result Value  Ref Range   IFOBT Negative      EKG/XRAY:   Primary read interpreted by Dr. Marin Comment at [facility].   ASSESSMENT/PLAN: Encounter Diagnoses  Name Primary?  . Essential hypertension Yes  . Type 2 diabetes mellitus with foot ulcer   . Diabetic osteomyelitis   . Diabetic polyneuropathy associated with type 2 diabetes mellitus    Reviewed labs , his A1c was 8.1 and CMP was normal Will get labs after he gets his osteomyelitis issues resolved His diastolic BP is not well controlled so will add 12.5 mg of HCTZ , cont with Lisinopril 10 mg daily Medications refilled.  Increase gabapentin to 1 tab TID F/u in 2 months  Gross sideeffects, risk and benefits, and alternatives of medications d/w patient. Patient is aware that all medications have potential sideeffects and we are unable to predict every sideeffect or drug-drug interaction that may occur.  LE, [place], DO 07/20/2014 5:12 PM

## 2014-07-24 ENCOUNTER — Encounter: Payer: Self-pay | Admitting: *Deleted

## 2014-07-25 ENCOUNTER — Ambulatory Visit (INDEPENDENT_AMBULATORY_CARE_PROVIDER_SITE_OTHER): Payer: BLUE CROSS/BLUE SHIELD | Admitting: Internal Medicine

## 2014-07-25 ENCOUNTER — Encounter: Payer: Self-pay | Admitting: Internal Medicine

## 2014-07-25 DIAGNOSIS — M869 Osteomyelitis, unspecified: Secondary | ICD-10-CM | POA: Diagnosis not present

## 2014-07-25 LAB — C-REACTIVE PROTEIN: CRP: <0.5 mg/dL (ref <0.60)

## 2014-07-25 LAB — SEDIMENTATION RATE: SED RATE: 21 mm/h — AB (ref 0–16)

## 2014-07-25 NOTE — Progress Notes (Signed)
Patient ID: Randall Morton, male   DOB: 10/25/1970, 44 y.o.   MRN: [id number]         [facility]  Reason for Consult: Left great toe infection Referring Physician: Dr. Illene Bolus  Patient Active Problem List   Diagnosis Date Noted  . Toe osteomyelitis, left 07/11/2014    Priority: High  . Diabetes mellitus type II, uncontrolled 08/26/2013  . Neuropathy due to secondary diabetes 08/26/2013  . HTN (hypertension) 08/26/2013  . Obesity, Class III, BMI 40-49.9 (morbid obesity) 09/17/2012  . DYSMETABOLIC SYNDROME X 09/08/2007  . HYPERTENSION 02/24/2007  . DM w/o Complication Type II 11/06/2006    Patient's Medications  New Prescriptions   No medications on file  Previous Medications   AMOXICILLIN-CLAVULANATE (AUGMENTIN) 875-125 MG PER TABLET    Take 1 tablet by mouth 2 (two) times daily.   GABAPENTIN (NEURONTIN) 100 MG CAPSULE    Take 1 capsule (100 mg total) by mouth at bedtime.   GLYBURIDE (DIABETA) 5 MG TABLET    Take 1 tablet (5 mg total) by mouth 2 (two) times daily with a meal.   HYDROCHLOROTHIAZIDE (HYDRODIURIL) 25 MG TABLET    Take 1/2 tab po daily   IBUPROFEN (ADVIL,MOTRIN) 200 MG TABLET    Take 200 mg by mouth every 6 (six) hours as needed for pain.   LISINOPRIL (PRINIVIL,ZESTRIL) 10 MG TABLET    Take 1 tablet (10 mg total) by mouth daily.   METFORMIN (GLUCOPHAGE) 1000 MG TABLET    Take 1 tablet (1,000 mg total) by mouth 2 (two) times daily with a meal.  Modified Medications   No medications on file  Discontinued Medications   HYDROCODONE-ACETAMINOPHEN (NORCO/VICODIN) 5-325 MG PER TABLET    Take 1 tablet by mouth once.    Recommendations: 1. Continue amoxicillin clavulanate 2. Check sedimentation rate and C-reactive protein 3. Continue to work on better glycemic control 4. Follow-up in 3-4 weeks   Assessment: Mr. Malcomb had soft tissue infection and possible osteomyelitis of the distal phalanx of his left great toe complicating a chronic  neuropathic ulcer. He has shown significant improvement with empiric amoxicillin clavulanate over the past 3 weeks. I think it is quite possible that his infection can be cured with medical therapy alone and would consider holding off on surgery for now. I will check a sedimentation rate and C-reactive protein and see him back in 3 weeks. I talked with him about the importance of improving glycemic control.   HPI: Randall Morton is a 44 y.o. male with diabetes and peripheral neuropathy. He used to work as a Naval architect but changed jobs in 2014. 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 When he started work there he changed to steel toed boots. Shortly after that he noticed that he had developed blisters on both great toes. He was seen in the emergency department in September 2014. No infections were noted at that time. The blister on his right great toe healed but he was left with a chronic ulcer on the plantar surface of his left great toe. He is unsure when he noticed worsening but he thinks it was been in the past 2-3 months. He began to notice some swelling of the left great toe extending up onto the dorsum of the foot with some redness, discomfort and drainage from the ulcer. He does not recall having any fever, chills or sweats. He does not measure his blood sugars at home.  He knows that his A1c most recently was 8.1 which was an improvement over previous measurements. He was seen by Dr. Leticia PennaZiegler on 07/04/2014. He was started on empiric amoxicillin clavulanate which he has now taken for the past 3 weeks. An MRI was done of his left foot which showed a question of osteomyelitis in the distal phalanx of the left great toe versus a fracture. He was seen back by Dr. Leticia PennaZiegler on 07/11/2014 to discuss possible surgery with distal, partial amputation of the toe and skin grafting. That had been tentatively scheduled for 07/31/2014. He states that he has noted fairly dramatic and  rapid improvement in his foot since starting antibiotics. The swelling has improved and he no longer has any redness, pain or drainage from the ulcer. He states that the ulcer is healing and is no longer nearly as deep as it was.  Review of Systems: Constitutional: negative for anorexia, chills, fevers, sweats and weight loss Eyes: negative Ears, nose, mouth, throat, and face: negative Respiratory: negative Cardiovascular: negative Gastrointestinal: negative Genitourinary:negative    Past Medical History  Diagnosis Date  . Diabetes mellitus without complication   . Hypertension   . Diabetic retinopathy     last checked on 04/2014  . Osteomyelitis     left foot ulcer--> osteo    History  Substance Use Topics  . Smoking status: Never Smoker   . Smokeless tobacco: Not on file  . Alcohol Use: 0.0 oz/week    0 Not specified per week     Comment: occasionallly    Family History  Problem Relation Age of Onset  . Asthma Mother   . Diabetes Father   . Heart disease Father   . Hyperlipidemia Father   . Hypertension Father   . Stroke Father   . Prostate cancer Paternal Grandfather    No Known Allergies  OBJECTIVE: Blood pressure 126/85, pulse 108, temperature 98.4 F (36.9 C), temperature source Oral, weight 315 lb (142.883 kg). General: He is alert and in no distress Left foot: His foot is warm and well perfused with good pulses. His left great toe has some dry skin with hyperpigmentation. There is no cellulitis or fluctuance. There is no tenderness with palpation. He has a shallow ulcer on the plantar surface of the left great toe. There is no drainage or odor  Microbiology: No results found for this or any previous visit (from the past 240 hour(s)).  Cliffton AstersJohn Cashay Manganelli, MD [facility] [phone] pager   [phone] cell 07/25/2014, 12:11 PM

## 2014-08-08 ENCOUNTER — Ambulatory Visit (INDEPENDENT_AMBULATORY_CARE_PROVIDER_SITE_OTHER): Payer: BLUE CROSS/BLUE SHIELD | Admitting: Family Medicine

## 2014-08-08 ENCOUNTER — Telehealth: Payer: Self-pay

## 2014-08-08 ENCOUNTER — Telehealth: Payer: Self-pay | Admitting: *Deleted

## 2014-08-08 VITALS — BP 124/88 | HR 100 | Temp 98.0°F | Resp 18 | Wt 312.4 lb

## 2014-08-08 DIAGNOSIS — R1013 Epigastric pain: Secondary | ICD-10-CM

## 2014-08-08 DIAGNOSIS — K2990 Gastroduodenitis, unspecified, without bleeding: Secondary | ICD-10-CM

## 2014-08-08 DIAGNOSIS — K2970 Gastritis, unspecified, without bleeding: Secondary | ICD-10-CM

## 2014-08-08 LAB — POCT CBC
GRANULOCYTE PERCENT: 56.7 % (ref 37–80)
HEMATOCRIT: 48.1 % (ref 43.5–53.7)
HEMOGLOBIN: 15.7 g/dL (ref 14.1–18.1)
Lymph, poc: 4.9 — AB (ref 0.6–3.4)
MCH: 30 pg (ref 27–31.2)
MCHC: 32.7 g/dL (ref 31.8–35.4)
MCV: 91.6 fL (ref 80–97)
MID (cbc): 0.9 (ref 0–0.9)
MPV: 8.7 fL (ref 0–99.8)
POC GRANULOCYTE: 7.6 — AB (ref 2–6.9)
POC LYMPH PERCENT: 36.9 %L (ref 10–50)
POC MID %: 6.4 % (ref 0–12)
Platelet Count, POC: 315 10*3/uL (ref 142–424)
RBC: 5.25 M/uL (ref 4.69–6.13)
RDW, POC: 13.6 %
WBC: 13.4 10*3/uL — AB (ref 4.6–10.2)

## 2014-08-08 LAB — POCT URINALYSIS DIPSTICK
GLUCOSE UA: NEGATIVE
Ketones, UA: 15
Leukocytes, UA: NEGATIVE
Nitrite, UA: NEGATIVE
PROTEIN UA: 30
RBC UA: NEGATIVE
SPEC GRAV UA: 1.025
Urobilinogen, UA: 1
pH, UA: 6

## 2014-08-08 LAB — COMPLETE METABOLIC PANEL WITH GFR
ALT: 16 U/L (ref 0–53)
AST: 14 U/L (ref 0–37)
Albumin: 4 g/dL (ref 3.5–5.2)
Alkaline Phosphatase: 70 U/L (ref 39–117)
BILIRUBIN TOTAL: 0.6 mg/dL (ref 0.2–1.2)
BUN: 9 mg/dL (ref 6–23)
CHLORIDE: 99 meq/L (ref 96–112)
CO2: 28 mEq/L (ref 19–32)
Calcium: 9.7 mg/dL (ref 8.4–10.5)
Creat: 1 mg/dL (ref 0.50–1.35)
GFR, Est African American: >89 mL/min
GFR, Est Non African American: >89 mL/min
Glucose, Bld: 124 mg/dL — ABNORMAL HIGH (ref 70–99)
Potassium: 4.6 mEq/L (ref 3.5–5.3)
SODIUM: 135 meq/L (ref 135–145)
Total Protein: 7.6 g/dL (ref 6.0–8.3)

## 2014-08-08 LAB — GLUCOSE, POCT (MANUAL RESULT ENTRY): POC GLUCOSE: 135 mg/dL — AB (ref 70–99)

## 2014-08-08 LAB — POCT UA - MICROSCOPIC ONLY
BACTERIA, U MICROSCOPIC: NEGATIVE
CASTS, UR, LPF, POC: NEGATIVE
CRYSTALS, UR, HPF, POC: NEGATIVE
Mucus, UA: NEGATIVE
RBC, URINE, MICROSCOPIC: NEGATIVE
Yeast, UA: NEGATIVE

## 2014-08-08 LAB — POCT SEDIMENTATION RATE: POCT SED RATE: 79 mm/hr — AB (ref 0–22)

## 2014-08-08 LAB — LIPASE: Lipase: 50 U/L (ref 0–75)

## 2014-08-08 LAB — POC HEMOCCULT BLD/STL (OFFICE/1-CARD/DIAGNOSTIC): FECAL OCCULT BLD: NEGATIVE

## 2014-08-08 MED ORDER — GI COCKTAIL ~~LOC~~: 30.0000 mL | Freq: Once | ORAL | Status: DC

## 2014-08-08 MED ORDER — SUCRALFATE 1 G PO TABS: 1.0000 g | ORAL_TABLET | Freq: Three times a day (TID) | ORAL | Status: DC

## 2014-08-08 MED ORDER — OMEPRAZOLE 40 MG PO CPDR: 40.0000 mg | DELAYED_RELEASE_CAPSULE | Freq: Every day | ORAL | Status: DC

## 2014-08-08 MED ORDER — OMEPRAZOLE 40 MG PO CPDR: 40.0000 mg | DELAYED_RELEASE_CAPSULE | Freq: Two times a day (BID) | ORAL | Status: DC

## 2014-08-08 NOTE — Telephone Encounter (Signed)
Pt reporting abdominal pain, nausea and vomiting.  Believes it is related to the IV antibiotics he is taking.  RN spoke with Dr. Luciana Axeomer.  Problem may be gastroenteritis.  Dr. Luciana Axeomer recommended that the pt see his PCP.  Pt verbalized understanding and stated he would call his PCP.

## 2014-08-08 NOTE — Progress Notes (Addendum)
Subjective:    Patient ID: Randall Morton, male    DOB: May 29, 1971, 44 y.o.   MRN: [id number] This chart was scribed for Norberto Sorenson, MD by Littie Deeds, Medical Scribe. This patient was seen in Room 1 and the patient's care was started at 1:46 PM.   Chief Complaint  Patient presents with  . Abdominal Pain    on and off for the past 2 weeks. Very severe for the past few days. Upper stomach pain.   . Nausea    w\vomitting.     HPI  HPI Comments: Randall Morton is a 44 y.o. male with a hx of HTN, DM, left toe osteomyelitis and obesity who presents to the [facility] complaining of gradual onset, constant, waxing and waning, cramping/"knotty" RUQ and LUQ abdominal pain radiating around his sides that started a couple weeks ago. He states his abdominal pain lasts a few days; the pain will resolve, but will return with higher intensity. The pain ranges from a 5/10 to a 9/10 in severity. Patient reports having associated chills yesterday and 3 episodes of vomiting of yellow, mucus colored discharge last night, and he also notes that he was gagging one day last week. He states that he cannot eat when the pain is there. He initially thought it was due to HCTZ which he was recently started on. He stopped taking the medication which did provide relief at first, but the pain did return. He also took a medication for constipation which also provided relief at first, but the pain returned. Patient has not tried any other OTC medications and states that he has not been on ibuprofen for a while, not since his symptoms started. The pain is sometimes improved with bowel movements. He states that the pain is worsened with movement. Patient denies diarrhea, blood in vomit, coffee-grounds emesis, black/tarry stools, nausea currently and urinary symptoms. He has been having regular bowel movements. He also denies EtOH use, hx of pancrease problems and any surgeries. Patient has had indigestion in the past, and  he states his current symptoms do not feel like indigestion.  He reports his last A1C was 8.1 and states he has not checked his CBG recently. He states he has been on Cipro BID for 2 weeks.   Past Medical History  Diagnosis Date  . Diabetes mellitus without complication   . Hypertension   . Diabetic retinopathy     last checked on 04/2014  . Osteomyelitis     left foot ulcer--> osteo   Current Outpatient Prescriptions on File Prior to Visit  Medication Sig Dispense Refill  . gabapentin (NEURONTIN) 100 MG capsule Take 1 capsule (100 mg total) by mouth at bedtime. 30 capsule 6  . glyBURIDE (DIABETA) 5 MG tablet Take 1 tablet (5 mg total) by mouth 2 (two) times daily with a meal. 60 tablet 6  . hydrochlorothiazide (HYDRODIURIL) 25 MG tablet Take 1/2 tab po daily 30 tablet 3  . lisinopril (PRINIVIL,ZESTRIL) 10 MG tablet Take 1 tablet (10 mg total) by mouth daily. 30 tablet 6  . metFORMIN (GLUCOPHAGE) 1000 MG tablet Take 1 tablet (1,000 mg total) by mouth 2 (two) times daily with a meal. 60 tablet 6   No current facility-administered medications on file prior to visit.   No Known Allergies  Review of Systems  Constitutional: Positive for chills.  Gastrointestinal: Positive for vomiting and abdominal pain. Negative for nausea, diarrhea and blood in stool.  Genitourinary: Negative.  Objective:  BP 124/88 mmHg  Pulse 100  Temp(Src) 98 F (36.7 C) (Oral)  Resp 18  Wt 312 lb 6.4 oz (141.704 kg)  SpO2 98%  Physical Exam  Constitutional: He is oriented to person, place, and time. He appears well-developed and well-nourished. No distress.  HENT:  Head: Normocephalic and atraumatic.  Mouth/Throat: Oropharynx is clear and moist. No oropharyngeal exudate.  Eyes: Pupils are equal, round, and reactive to light.  Neck: Neck supple.  Cardiovascular: Regular rhythm, S1 normal and S2 normal.  Tachycardia present.   No murmur heard. Pulmonary/Chest: Effort normal and breath sounds  normal. No respiratory distress. He has no wheezes. He has no rales.  CTAB. Good air movement.  Abdominal: Soft. Bowel sounds are normal. He exhibits no distension and no mass. There is tenderness in the right upper quadrant, epigastric area and left upper quadrant. There is no rebound, no guarding and no CVA tenderness.  TTP epigastrium, RUQ and LUQ.  Musculoskeletal: He exhibits no edema.  Neurological: He is alert and oriented to person, place, and time. No cranial nerve deficit.  Skin: Skin is warm and dry. No rash noted.  Psychiatric: He has a normal mood and affect. His behavior is normal.  Vitals reviewed.  Results for orders placed or performed in visit on 08/08/14  POCT CBC  Result Value Ref Range   WBC 13.4 (A) 4.6 - 10.2 K/uL   Lymph, poc 4.9 (A) 0.6 - 3.4   POC LYMPH PERCENT 36.9 10 - 50 %L   MID (cbc) 0.9 0 - 0.9   POC MID % 6.4 0 - 12 %M   POC Granulocyte 7.6 (A) 2 - 6.9   Granulocyte percent 56.7 37 - 80 %G   RBC 5.25 4.69 - 6.13 M/uL   Hemoglobin 15.7 14.1 - 18.1 g/dL   HCT, POC 16.148.1 09.643.5 - 53.7 %   MCV 91.6 80 - 97 fL   MCH, POC 30.0 27 - 31.2 pg   MCHC 32.7 31.8 - 35.4 g/dL   RDW, POC 04.513.6 %   Platelet Count, POC 315 142 - 424 K/uL   MPV 8.7 0 - 99.8 fL  POCT glucose (manual entry)  Result Value Ref Range   POC Glucose 135 (A) 70 - 99 mg/dl  POCT urinalysis dipstick  Result Value Ref Range   Color, UA orange    Clarity, UA clear    Glucose, UA neg    Bilirubin, UA small    Ketones, UA 15    Spec Grav, UA 1.025    Blood, UA neg    pH, UA 6.0    Protein, UA 30    Urobilinogen, UA 1.0    Nitrite, UA neg    Leukocytes, UA Negative   POCT UA - Microscopic Only  Result Value Ref Range   WBC, Ur, HPF, POC 0-1    RBC, urine, microscopic neg    Bacteria, U Microscopic neg    Mucus, UA neg    Epithelial cells, urine per micros 0-2    Crystals, Ur, HPF, POC neg    Casts, Ur, LPF, POC neg    Yeast, UA neg    Granular Casts, UA 0-1        Assessment &  Plan:   Abdominal pain, epigastric - Plan: POCT CBC, POCT glucose (manual entry), POCT SEDIMENTATION RATE, POCT urinalysis dipstick, POCT UA - Microscopic Only, COMPLETE METABOLIC PANEL WITH GFR, Lipase, POC Hemoccult Bld/Stl (1-Cd Office Dx), DISCONTINUED: gi cocktail (Maalox,Lidocaine,Donnatal), CANCELED:  Occult blood card to lab, stool  Gastritis and gastroduodenitis - Plan: POC Hemoccult Bld/Stl (1-Cd Office Dx) I suspect that pt's sxs are due to gastritis or possible PUD from stress of recent toe osteomyelitis as well as prolonged antibiotic courses (initially on augmentin, now on cipro.)  Sxs certainly could be due to cipro but as pt's osteo seems to be responding well will try treatment with ppi initially. Reviewed low acid, low fat, low carb diet - push protein. If sxs continue, may want to consider imaging to r/o cholelithiasis or H. Pylori breath test. May need to check w/ ID/ortho about changing antibiotic if sxs persist.  Try to augment w/ probiotics - yogurt.  Meds ordered this encounter  Medications  . ciprofloxacin (CIPRO) 500 MG tablet    Sig: Take 500 mg by mouth 2 (two) times daily.  [name] DISCONTD: gi cocktail (Maalox,Lidocaine,Donnatal)    Sig:   . DISCONTD: omeprazole (PRILOSEC) 40 MG capsule    Sig: Take 1 capsule (40 mg total) by mouth daily.    Dispense:  30 capsule    Refill:  3  . sucralfate (CARAFATE) 1 G tablet    Sig: Take 1 tablet (1 g total) by mouth 4 (four) times daily -  with meals and at bedtime.    Dispense:  60 tablet    Refill:  0  . omeprazole (PRILOSEC) 40 MG capsule    Sig: Take 1 capsule (40 mg total) by mouth 2 (two) times daily.    Dispense:  60 capsule    Refill:  1    I personally performed the services described in this documentation, which was scribed in my presence. The recorded information has been reviewed and considered, and addended by me as needed.  Norberto Sorenson, MD MPH  Results for orders placed or performed in visit on 08/08/14  COMPLETE  METABOLIC PANEL WITH GFR  Result Value Ref Range   Sodium 135 135 - 145 mEq/L   Potassium 4.6 3.5 - 5.3 mEq/L   Chloride 99 96 - 112 mEq/L   CO2 28 19 - 32 mEq/L   Glucose, Bld 124 (H) 70 - 99 mg/dL   BUN 9 6 - 23 mg/dL   Creat 9.62 9.52 - 8.41 mg/dL   Total Bilirubin 0.6 0.2 - 1.2 mg/dL   Alkaline Phosphatase 70 39 - 117 U/L   AST 14 0 - 37 U/L   ALT 16 0 - 53 U/L   Total Protein 7.6 6.0 - 8.3 g/dL   Albumin 4.0 3.5 - 5.2 g/dL   Calcium 9.7 8.4 - 32.4 mg/dL   GFR, Est African American >89 mL/min   GFR, Est Non African American >89 mL/min  Lipase  Result Value Ref Range   Lipase 50 0 - 75 U/L  POCT CBC  Result Value Ref Range   WBC 13.4 (A) 4.6 - 10.2 K/uL   Lymph, poc 4.9 (A) 0.6 - 3.4   POC LYMPH PERCENT 36.9 10 - 50 %L   MID (cbc) 0.9 0 - 0.9   POC MID % 6.4 0 - 12 %M   POC Granulocyte 7.6 (A) 2 - 6.9   Granulocyte percent 56.7 37 - 80 %G   RBC 5.25 4.69 - 6.13 M/uL   Hemoglobin 15.7 14.1 - 18.1 g/dL   HCT, POC 40.1 02.7 - 53.7 %   MCV 91.6 80 - 97 fL   MCH, POC 30.0 27 - 31.2 pg   MCHC 32.7 31.8 - 35.4 g/dL  RDW, POC 13.6 %   Platelet Count, POC 315 142 - 424 K/uL   MPV 8.7 0 - 99.8 fL  POCT glucose (manual entry)  Result Value Ref Range   POC Glucose 135 (A) 70 - 99 mg/dl  POCT SEDIMENTATION RATE  Result Value Ref Range   POCT SED RATE 79 (A) 0 - 22 mm/hr  POCT urinalysis dipstick  Result Value Ref Range   Color, UA orange    Clarity, UA clear    Glucose, UA neg    Bilirubin, UA small    Ketones, UA 15    Spec Grav, UA 1.025    Blood, UA neg    pH, UA 6.0    Protein, UA 30    Urobilinogen, UA 1.0    Nitrite, UA neg    Leukocytes, UA Negative   POCT UA - Microscopic Only  Result Value Ref Range   WBC, Ur, HPF, POC 0-1    RBC, urine, microscopic neg    Bacteria, U Microscopic neg    Mucus, UA neg    Epithelial cells, urine per micros 0-2    Crystals, Ur, HPF, POC neg    Casts, Ur, LPF, POC neg    Yeast, UA neg    Granular Casts, UA 0-1   POC  Hemoccult Bld/Stl (1-Cd Office Dx)  Result Value Ref Range   Card #1 Date 08/08/2014    Fecal Occult Blood, POC Negative

## 2014-08-08 NOTE — Patient Instructions (Signed)
Low-Fat Diet for Pancreatitis or Gallbladder Conditions A low-fat diet can be helpful if you have pancreatitis or a gallbladder condition. With these conditions, your pancreas and gallbladder have trouble digesting fats. A healthy eating plan with less fat will help rest your pancreas and gallbladder and reduce your symptoms. WHAT DO I NEED TO KNOW ABOUT THIS DIET?  Eat a low-fat diet.  Reduce your fat intake to less than 20-30% of your total daily calories. This is less than 50-60 g of fat per day.  Remember that you need some fat in your diet. Ask your dietician what your daily goal should be.  Choose nonfat and low-fat healthy foods. Look for the words "nonfat," "low fat," or "fat free."  As a guide, look on the label and choose foods with less than 3 g of fat per serving. Eat only one serving.  Avoid alcohol.  Do not smoke. If you need help quitting, talk with your health care provider.  Eat small frequent meals instead of three large heavy meals. WHAT FOODS CAN I EAT? Grains Include healthy grains and starches such as potatoes, wheat bread, fiber-rich cereal, and brown rice. Choose whole grain options whenever possible. In adults, whole grains should account for 45-65% of your daily calories.  Fruits and Vegetables Eat plenty of fruits and vegetables. Fresh fruits and vegetables add fiber to your diet. Meats and Other Protein Sources Eat lean meat such as chicken and pork. Trim any fat off of meat before cooking it. Eggs, fish, and beans are other sources of protein. In adults, these foods should account for 10-35% of your daily calories. Dairy Choose low-fat milk and dairy options. Dairy includes fat and protein, as well as calcium.  Fats and Oils Limit high-fat foods such as fried foods, sweets, baked goods, sugary drinks.  Other Creamy sauces and condiments, such as mayonnaise, can add extra fat. Think about whether or not you need to use them, or use smaller amounts or low fat  options. WHAT FOODS ARE NOT RECOMMENDED?  High fat foods, such as:  [employer].  Ice cream.  [place] toast.  Sweet rolls.  Pizza.  Cheese bread.  Foods covered with batter, butter, creamy sauces, or cheese.  Fried foods.  Sugary drinks and desserts.  Foods that cause gas or bloating Document Released: 06/14/2013 Document Reviewed: 06/14/2013 [facility] Patient Information 2015 [place], [place]. This information is not intended to replace advice given to you by your health care provider. Make sure you discuss any questions you have with your health care provider.  Food Choices for Gastroesophageal Reflux Disease When you have gastroesophageal reflux disease (GERD), the foods you eat and your eating habits are very important. Choosing the right foods can help ease the discomfort of GERD. WHAT GENERAL GUIDELINES DO I NEED TO FOLLOW?  Choose fruits, vegetables, whole grains, low-fat dairy products, and low-fat meat, fish, and poultry.  Limit fats such as oils, salad dressings, butter, nuts, and avocado.  Keep a food diary to identify foods that cause symptoms.  Avoid foods that cause reflux. These may be different for different people.  Eat frequent small meals instead of three large meals each day.  Eat your meals slowly, in a relaxed setting.  Limit fried foods.  Cook foods using methods other than frying.  Avoid drinking alcohol.  Avoid drinking large amounts of liquids with your meals.  Avoid bending over or lying down until 2-3 hours after eating. WHAT FOODS ARE NOT RECOMMENDED? The following are some foods  and drinks that may worsen your symptoms: Vegetables Tomatoes. Tomato juice. Tomato and spaghetti sauce. Chili peppers. Onion and garlic. Horseradish. Fruits Oranges, grapefruit, and lemon (fruit and juice). Meats High-fat meats, fish, and poultry. This includes hot dogs, ribs, ham, sausage, salami, and bacon. Dairy Whole milk and chocolate milk. Sour  cream. Cream. Butter. Ice cream. Cream cheese.  Beverages Coffee and tea, with or without caffeine. Carbonated beverages or energy drinks. Condiments Hot sauce. Barbecue sauce.  Sweets/Desserts Chocolate and cocoa. Donuts. Peppermint and spearmint. Fats and Oils High-fat foods, including [place] fries and potato chips. Other Vinegar. Strong spices, such as black pepper, white pepper, red pepper, cayenne, curry powder, cloves, ginger, and chili powder. The items listed above may not be a complete list of foods and beverages to avoid. Contact your dietitian for more information. Document Released: 06/09/2005 Document Revised: 06/14/2013 Document Reviewed: 04/13/2013 [facility] Patient Information 2015 [place]. This information is not intended to replace advice given to you by your health care provider. Make sure you discuss any questions you have with your health care provider.   Gastritis, Adult Gastritis is soreness and swelling (inflammation) of the lining of the stomach. Gastritis can develop as a sudden onset (acute) or long-term (chronic) condition. If gastritis is not treated, it can lead to stomach bleeding and ulcers. CAUSES  Gastritis occurs when the stomach lining is weak or damaged. Digestive juices from the stomach then inflame the weakened stomach lining. The stomach lining may be weak or damaged due to viral or bacterial infections. One common bacterial infection is the Helicobacter pylori infection. Gastritis can also result from excessive alcohol consumption, taking certain medicines, or having too much acid in the stomach.  SYMPTOMS  In some cases, there are no symptoms. When symptoms are present, they may include:  Pain or a burning sensation in the upper abdomen.  Nausea.  Vomiting.  An uncomfortable feeling of fullness after eating. DIAGNOSIS  Your caregiver may suspect you have gastritis based on your symptoms and a physical exam. To determine the cause of your  gastritis, your caregiver may perform the following:  Blood or stool tests to check for the H pylori bacterium.  Gastroscopy. A thin, flexible tube (endoscope) is passed down the esophagus and into the stomach. The endoscope has a light and camera on the end. Your caregiver uses the endoscope to view the inside of the stomach.  Taking a tissue sample (biopsy) from the stomach to examine under a microscope. TREATMENT  Depending on the cause of your gastritis, medicines may be prescribed. If you have a bacterial infection, such as an H pylori infection, antibiotics may be given. If your gastritis is caused by too much acid in the stomach, H2 blockers or antacids may be given. Your caregiver may recommend that you stop taking aspirin, ibuprofen, or other nonsteroidal anti-inflammatory drugs (NSAIDs). HOME CARE INSTRUCTIONS  Only take over-the-counter or prescription medicines as directed by your caregiver.  If you were given antibiotic medicines, take them as directed. Finish them even if you start to feel better.  Drink enough fluids to keep your urine clear or pale yellow.  Avoid foods and drinks that make your symptoms worse, such as:  Caffeine or alcoholic drinks.  Chocolate.  Peppermint or mint flavorings.  Garlic and onions.  Spicy foods.  Citrus fruits, such as oranges, lemons, or limes.  Tomato-based foods such as sauce, chili, salsa, and pizza.  Fried and fatty foods.  Eat small, frequent meals instead of large meals.  SEEK IMMEDIATE MEDICAL CARE IF:   You have black or dark red stools.  You vomit blood or material that looks like coffee grounds.  You are unable to keep fluids down.  Your abdominal pain gets worse.  You have a fever.  You do not feel better after 1 week.  You have any other questions or concerns. MAKE SURE YOU:  Understand these instructions.  Will watch your condition.  Will get help right away if you are not doing well or get  worse. Document Released: 06/03/2001 Document Revised: 12/09/2011 Document Reviewed: 07/23/2011 [facility] Patient Information 2015 [place], [place]. This information is not intended to replace advice given to you by your health care provider. Make sure you discuss any questions you have with your health care provider.

## 2014-08-08 NOTE — Telephone Encounter (Signed)
Great advice, unable to address symptoms over the phone.

## 2014-08-08 NOTE — Telephone Encounter (Signed)
Pt called here stating he was in extreme abdominal pain with vomiting. He thinks it's his medication combination. This has been going on for a while. I told him he should come on in. He said he would.

## 2014-08-09 ENCOUNTER — Encounter: Payer: Self-pay | Admitting: Family Medicine

## 2014-08-10 ENCOUNTER — Telehealth: Payer: Self-pay

## 2014-08-10 NOTE — Telephone Encounter (Signed)
Patient calling to his lab results from his visit on 08/08/14. Patient's call back number is  [phone]

## 2014-08-11 NOTE — Telephone Encounter (Signed)
LMOM that letter was sent

## 2014-08-22 ENCOUNTER — Encounter: Payer: Self-pay | Admitting: Internal Medicine

## 2014-08-22 ENCOUNTER — Ambulatory Visit (INDEPENDENT_AMBULATORY_CARE_PROVIDER_SITE_OTHER): Payer: BLUE CROSS/BLUE SHIELD | Admitting: Internal Medicine

## 2014-08-22 DIAGNOSIS — M869 Osteomyelitis, unspecified: Secondary | ICD-10-CM | POA: Diagnosis not present

## 2014-08-22 LAB — SEDIMENTATION RATE: Sed Rate: 24 mm/hr — ABNORMAL HIGH (ref 0–15)

## 2014-08-22 NOTE — Progress Notes (Addendum)
Patient ID: Randall Morton, male   DOB: [birth year], 44 y.o.   MRN: [id number]         [facility]  Patient Active Problem List   Diagnosis Date Noted  . Toe osteomyelitis, left 07/11/2014    Priority: High  . Diabetes mellitus type II, uncontrolled 08/26/2013  . Neuropathy due to secondary diabetes 08/26/2013  . HTN (hypertension) 08/26/2013  . Obesity, Class III, BMI 40-49.9 (morbid obesity) 09/17/2012  . DYSMETABOLIC SYNDROME X 09/08/2007  . HYPERTENSION 02/24/2007  . DM w/o Complication Type II 11/06/2006    Patient's Medications  New Prescriptions   No medications on file  Previous Medications   CIPROFLOXACIN (CIPRO) 500 MG TABLET    Take 500 mg by mouth 2 (two) times daily.   GABAPENTIN (NEURONTIN) 100 MG CAPSULE    Take 1 capsule (100 mg total) by mouth at bedtime.   GLYBURIDE (DIABETA) 5 MG TABLET    Take 1 tablet (5 mg total) by mouth 2 (two) times daily with a meal.   HYDROCHLOROTHIAZIDE (HYDRODIURIL) 25 MG TABLET    Take 1/2 tab po daily   LISINOPRIL (PRINIVIL,ZESTRIL) 10 MG TABLET    Take 1 tablet (10 mg total) by mouth daily.   METFORMIN (GLUCOPHAGE) 1000 MG TABLET    Take 1 tablet (1,000 mg total) by mouth 2 (two) times daily with a meal.   OMEPRAZOLE (PRILOSEC) 40 MG CAPSULE    Take 1 capsule (40 mg total) by mouth 2 (two) times daily.   SUCRALFATE (CARAFATE) 1 G TABLET    Take 1 tablet (1 g total) by mouth 4 (four) times daily -  with meals and at bedtime.  Modified Medications   No medications on file  Discontinued Medications   No medications on file    Subjective: Randall Morton is in for his routine follow-up of his left great toe osteomyelitis. He is now completed 7 weeks of antibiotic therapy. He was initially treated with amoxicillin clavulanate but apparently was switched to oral ciprofloxacin by Dr. Leticia Penna sometime in the past month. He is not sure why the switch was made. He has not had any problems tolerating his antibiotics. He is  feeling better. He is not having any to pain. He is back to his regular exercise. Review of Systems: Pertinent items are noted in HPI.  Past Medical History  Diagnosis Date  . Diabetes mellitus without complication   . Hypertension   . Diabetic retinopathy     last checked on 04/2014  . Osteomyelitis     left foot ulcer--> osteo    History  Substance Use Topics  . Smoking status: Never Smoker   . Smokeless tobacco: Not on file  . Alcohol Use: 0.0 oz/week    0 Standard drinks or equivalent per week     Comment: occasionallly    Family History  Problem Relation Age of Onset  . Asthma Mother   . Diabetes Father   . Heart disease Father   . Hyperlipidemia Father   . Hypertension Father   . Stroke Father   . Prostate cancer Paternal Grandfather     No Known Allergies  Objective: Temp: 98 F (36.7 C) (03/01 0951) Temp Source: Oral (03/01 0951) BP: 149/99 mmHg (03/01 0951) Pulse Rate: 97 (03/01 0951)  General: He is in no distress Left foot: He has some hyperpigmentation of the skin over his left great toe but no erythema, warmth, swelling or pain with range of motion.  SED RATE (mm/hr)  Date Value  07/25/2014 21*   CRP (mg/dL)  Date Value  40/98/119102/07/2014 <0.5     Assessment: He is improving on empiric antibiotic therapy for osteomyelitis of the distal tuft of his left great toe. I will repeat his inflammatory markers and obtain office notes from Dr. Leticia PennaZiegler. It may be time to stop antibiotics and observe.  Plan: 1. Consider stopping ciprofloxacin soon 2. Repeat sedimentation rate and C-reactive protein 3. Obtain Dr. Illene RegulusZiegler's office notes 4. Follow-up in 6 weeks   Randall AstersJohn Johari Pinney, MD [facility] [phone] pager   [phone] cell 08/22/2014, 10:05 AM   Addendum: I reviewed office notes from Dr. Leticia PennaZiegler. X-rays done on 08/15/2014 showed decreased soft tissue swelling at the IPJ and decrease swelling around the  medial base of the distal phalanx with no erosive changes compared to previous x-rays.  SED RATE (mm/hr)  Date Value  08/22/2014 24*  07/25/2014 21*   CRP (mg/dL)  Date Value  78/46/962903/06/2014 <0.5  07/25/2014 <0.5   Given Randall Morton's clinical and radiographic improvement in low inflammatory markers I think it is best that he stop taking ciprofloxacin at this time. I spoke him by phone today and he is in agreement with that plan.  Randall AstersJohn Jazlyn Tippens, MD [facility] [phone] pager   [phone] cell 08/23/2014, 1:01 PM

## 2014-08-23 LAB — C-REACTIVE PROTEIN: CRP: <0.5 mg/dL (ref <0.60)

## 2014-09-08 ENCOUNTER — Encounter: Payer: Self-pay | Admitting: Family Medicine

## 2014-10-05 ENCOUNTER — Encounter: Payer: Self-pay | Admitting: Internal Medicine

## 2014-10-05 ENCOUNTER — Ambulatory Visit (INDEPENDENT_AMBULATORY_CARE_PROVIDER_SITE_OTHER): Payer: BLUE CROSS/BLUE SHIELD | Admitting: Internal Medicine

## 2014-10-05 VITALS — BP 151/91 | HR 106 | Temp 98.2°F | Wt 319.8 lb

## 2014-10-05 DIAGNOSIS — M869 Osteomyelitis, unspecified: Secondary | ICD-10-CM | POA: Diagnosis not present

## 2014-10-05 NOTE — Progress Notes (Signed)
Patient ID: Randall Morton, male   DOB: 19-Sep-1970, 44 y.o.   MRN: [id number]         [facility]  Patient Active Problem List   Diagnosis Date Noted  . Toe osteomyelitis, left 07/11/2014    Priority: High  . Diabetes mellitus type II, uncontrolled 08/26/2013  . Neuropathy due to secondary diabetes 08/26/2013  . HTN (hypertension) 08/26/2013  . Obesity, Class III, BMI 40-49.9 (morbid obesity) 09/17/2012  . DYSMETABOLIC SYNDROME X 09/08/2007  . HYPERTENSION 02/24/2007  . Type II or unspecified type diabetes mellitus without mention of complication, not stated as uncontrolled 11/06/2006    Patient's Medications  New Prescriptions   No medications on file  Previous Medications   GABAPENTIN (NEURONTIN) 100 MG CAPSULE    Take 1 capsule (100 mg total) by mouth at bedtime.   GLYBURIDE (DIABETA) 5 MG TABLET    Take 1 tablet (5 mg total) by mouth 2 (two) times daily with a meal.   HYDROCHLOROTHIAZIDE (HYDRODIURIL) 25 MG TABLET    Take 1/2 tab po daily   LISINOPRIL (PRINIVIL,ZESTRIL) 10 MG TABLET    Take 1 tablet (10 mg total) by mouth daily.   METFORMIN (GLUCOPHAGE) 1000 MG TABLET    Take 1 tablet (1,000 mg total) by mouth 2 (two) times daily with a meal.   OMEPRAZOLE (PRILOSEC) 40 MG CAPSULE    Take 1 capsule (40 mg total) by mouth 2 (two) times daily.   SUCRALFATE (CARAFATE) 1 G TABLET    Take 1 tablet (1 g total) by mouth 4 (four) times daily -  with meals and at bedtime.  Modified Medications   No medications on file  Discontinued Medications   CIPROFLOXACIN (CIPRO) 500 MG TABLET    Take 500 mg by mouth 2 (two) times daily.    Subjective: Randall Morton is in for his routine follow-up of his left great toe osteomyelitis. He is now been off of antibiotics for a little over 7 weeks and continues to improve. He returned to work yesterday. 2 Pertinent items are noted in HPI.  Past Medical History  Diagnosis Date  . Diabetes mellitus without complication   .  Hypertension   . Diabetic retinopathy     last checked on 04/2014  . Osteomyelitis     left foot ulcer--> osteo    History  Substance Use Topics  . Smoking status: Never Smoker   . Smokeless tobacco: Not on file  . Alcohol Use: 0.0 oz/week    0 Standard drinks or equivalent per week     Comment: occasionallly    Family History  Problem Relation Age of Onset  . Asthma Mother   . Diabetes Father   . Heart disease Father   . Hyperlipidemia Father   . Hypertension Father   . Stroke Father   . Prostate cancer Paternal Grandfather     No Known Allergies  Objective: Temp: 98.2 F (36.8 C) (04/14 0905) Temp Source: Oral (04/14 0905) BP: 151/91 mmHg (04/14 0905) Pulse Rate: 106 (04/14 0905)  General: He is in no distress Left foot: He has a bland callus on the plantar surface of his left great toe without ulceration or drainage. He has no swelling, erythema, warmth or pain on range of motion of his toe.  SED RATE (mm/hr)  Date Value  08/22/2014 24*  07/25/2014 21*   CRP (mg/dL)  Date Value  40/98/119103/06/2014 <0.5  07/25/2014 <0.5     Assessment: His osteomyelitis and  cellulitis has resolved.  Plan: 1. Continue observation off of antibiotics 2. Follow-up here as needed.   Cliffton Asters, MD [facility] [phone] pager   [phone] cell 10/05/2014, 9:18 AM

## 2014-10-19 ENCOUNTER — Telehealth: Payer: Self-pay | Admitting: Family Medicine

## 2014-10-19 NOTE — Telephone Encounter (Signed)
lmom to call back to reschedule his appt Dr Nedra Hailee will not have clinic on 10/27/14

## 2014-10-27 ENCOUNTER — Encounter: Payer: BLUE CROSS/BLUE SHIELD | Admitting: Family Medicine

## 2015-01-30 ENCOUNTER — Encounter: Payer: Self-pay | Admitting: *Deleted

## 2015-07-31 ENCOUNTER — Emergency Department (HOSPITAL_COMMUNITY): Payer: BLUE CROSS/BLUE SHIELD

## 2015-07-31 ENCOUNTER — Emergency Department (HOSPITAL_COMMUNITY)
Admission: EM | Admit: 2015-07-31 | Discharge: 2015-07-31 | Disposition: A | Discharge status: Home / Self Care | Payer: BLUE CROSS/BLUE SHIELD | Attending: Emergency Medicine | Admitting: Emergency Medicine

## 2015-07-31 ENCOUNTER — Encounter (HOSPITAL_COMMUNITY): Payer: Self-pay

## 2015-07-31 DIAGNOSIS — Y93B9 Activity, other involving muscle strengthening exercises: Secondary | ICD-10-CM | POA: Insufficient documentation

## 2015-07-31 DIAGNOSIS — Z79899 Other long term (current) drug therapy: Secondary | ICD-10-CM | POA: Insufficient documentation

## 2015-07-31 DIAGNOSIS — M25512 Pain in left shoulder: Secondary | ICD-10-CM

## 2015-07-31 DIAGNOSIS — Y9289 Other specified places as the place of occurrence of the external cause: Secondary | ICD-10-CM | POA: Insufficient documentation

## 2015-07-31 DIAGNOSIS — I10 Essential (primary) hypertension: Secondary | ICD-10-CM | POA: Insufficient documentation

## 2015-07-31 DIAGNOSIS — Y999 Unspecified external cause status: Secondary | ICD-10-CM | POA: Insufficient documentation

## 2015-07-31 DIAGNOSIS — Z8739 Personal history of other diseases of the musculoskeletal system and connective tissue: Secondary | ICD-10-CM | POA: Insufficient documentation

## 2015-07-31 DIAGNOSIS — X58XXXA Exposure to other specified factors, initial encounter: Secondary | ICD-10-CM | POA: Insufficient documentation

## 2015-07-31 DIAGNOSIS — Z7984 Long term (current) use of oral hypoglycemic drugs: Secondary | ICD-10-CM | POA: Insufficient documentation

## 2015-07-31 DIAGNOSIS — E119 Type 2 diabetes mellitus without complications: Secondary | ICD-10-CM | POA: Insufficient documentation

## 2015-07-31 DIAGNOSIS — S4992XA Unspecified injury of left shoulder and upper arm, initial encounter: Secondary | ICD-10-CM | POA: Insufficient documentation

## 2015-07-31 MED ORDER — IBUPROFEN 800 MG PO TABS: 800.0000 mg | ORAL_TABLET | Freq: Three times a day (TID) | ORAL | Status: DC

## 2015-07-31 MED ORDER — METHOCARBAMOL 500 MG PO TABS: 500.0000 mg | ORAL_TABLET | Freq: Two times a day (BID) | ORAL | Status: DC

## 2015-07-31 NOTE — ED Notes (Signed)
Standing order for percocet offered to patient but he declined.

## 2015-07-31 NOTE — Discharge Instructions (Signed)
Randall Morton,  Nice meeting you! Please follow-up with orthopedics if you continue to have pain. Return to the emergency department if you develop numbness/tingling, inability to move your arm. Feel better soon!  S. Lane Hacker, PA-C   Heat Therapy Heat therapy can help ease sore, stiff, injured, and tight muscles and joints. Heat relaxes your muscles, which may help ease your pain. Heat therapy should only be used on old, pre-existing, or long-lasting (chronic) injuries. Do not use heat therapy unless told by your doctor. HOW TO USE HEAT THERAPY There are several different kinds of heat therapy, including:  Moist heat pack.  Warm water bath.  Hot water bottle.  Electric heating pad.  Heated gel pack.  Heated wrap.  Electric heating pad. GENERAL HEAT THERAPY RECOMMENDATIONS   Do not sleep while using heat therapy. Only use heat therapy while you are awake.  Your skin may turn pink while using heat therapy. Do not use heat therapy if your skin turns red.  Do not use heat therapy if you have new pain.  High heat or long exposure to heat can cause burns. Be careful when using heat therapy to avoid burning your skin.  Do not use heat therapy on areas of your skin that are already irritated, such as with a rash or sunburn. GET HELP IF:   You have blisters, redness, swelling (puffiness), or numbness.  You have new pain.  Your pain is worse. MAKE SURE YOU:  Understand these instructions.  Will watch your condition.  Will get help right away if you are not doing well or get worse.   This information is not intended to replace advice given to you by your health care provider. Make sure you discuss any questions you have with your health care provider.   Document Released: 09/01/2011 Document Revised: 06/30/2014 Document Reviewed: 08/02/2013 Elsevier Interactive Patient Education 2016 [employer]  Joint Pain Joint pain, which is also called arthralgia, can be  caused by many things. Joint pain often goes away when you follow your health care provider's instructions for relieving pain at home. However, joint pain can also be caused by conditions that require further treatment. Common causes of joint pain include:  Bruising in the area of the joint.  Overuse of the joint.  Wear and tear on the joints that occur with aging (osteoarthritis).  Various other forms of arthritis.  A buildup of a crystal form of uric acid in the joint (gout).  Infections of the joint (septic arthritis) or of the bone (osteomyelitis). Your health care provider may recommend medicine to help with the pain. If your joint pain continues, additional tests may be needed to diagnose your condition. HOME CARE INSTRUCTIONS Watch your condition for any changes. Follow these instructions as directed to lessen the pain that you are feeling.  Take medicines only as directed by your health care provider.  Rest the affected area for as long as your health care provider says that you should. If directed to do so, raise the painful joint above the level of your heart while you are sitting or lying down.  Do not do things that cause or worsen pain.  If directed, apply ice to the painful area:  Put ice in a plastic bag.  Place a towel between your skin and the bag.  Leave the ice on for 20 minutes, 2-3 times per day.  Wear an elastic bandage, splint, or sling as directed by your health care provider. Loosen the  elastic bandage or splint if your fingers or toes become numb and tingle, or if they turn cold and blue.  Begin exercising or stretching the affected area as directed by your health care provider. Ask your health care provider what types of exercise are safe for you.  Keep all follow-up visits as directed by your health care provider. This is important. SEEK MEDICAL CARE IF:  Your pain increases, and medicine does not help.  Your joint pain does not improve within 3  days.  You have increased bruising or swelling.  You have a fever.  You lose 10 lb (4.5 kg) or more without trying. SEEK IMMEDIATE MEDICAL CARE IF:  You are not able to move the joint.  Your fingers or toes become numb or they turn cold and blue.   This information is not intended to replace advice given to you by your health care provider. Make sure you discuss any questions you have with your health care provider.   Document Released: 06/09/2005 Document Revised: 06/30/2014 Document Reviewed: 03/21/2014 Elsevier Interactive Patient Education [employer]

## 2015-07-31 NOTE — ED Notes (Signed)
Pt states he was at the gym working out using the bench press and suddenly heard his left shoulder pop and now complains of left shoulder pain, worse with movement. No deformity noted.

## 2015-07-31 NOTE — ED Provider Notes (Signed)
CSN: [id number]     Arrival date & time 07/31/15  2012 History  By signing my name below, I, Ronney Lion, attest that this documentation has been prepared under the direction and in the presence of S. Lane Hacker, PA-C. Electronically Signed: Ronney Lion, ED Scribe. 07/31/2015. 9:50 PM.    Chief Complaint  Patient presents with  . Shoulder Pain   The history is provided by the patient. No language interpreter was used.   HPI Comments: Randall Morton is a 45 y.o. male with a history of DM, HTN, diabetic retinopathy, and osteomyelitis, who presents to the Emergency Department complaining of sudden-onset, constant, 6/10, aching left shoulder pain that onset PTA, after patient was at the gym doing a 225-lb bench press and heard a "pop" in his left shoulder. Patient states he has not tried any treatments or medications for his symptoms. Movement exacerbates his pain.   Past Medical History  Diagnosis Date  . Diabetes mellitus without complication (HCC)   . Hypertension   . Diabetic retinopathy (HCC)     last checked on 04/2014  . Osteomyelitis (HCC)     left foot ulcer--> osteo   History reviewed. No pertinent past surgical history. Family History  Problem Relation Age of Onset  . Asthma Mother   . Diabetes Father   . Heart disease Father   . Hyperlipidemia Father   . Hypertension Father   . Stroke Father   . Prostate cancer Paternal Grandfather    Social History  Substance Use Topics  . Smoking status: Never Smoker   . Smokeless tobacco: None  . Alcohol Use: 0.0 oz/week    0 Standard drinks or equivalent per week     Comment: occasionallly    Review of Systems  Constitutional: Negative for fever.  Musculoskeletal: Positive for arthralgias (left shoulder pain).  All other systems reviewed and are negative.  Allergies  Review of patient's allergies indicates no known allergies.  Home Medications   Prior to Admission medications   Medication Sig Start Date End Date Taking?  Authorizing Provider  gabapentin (NEURONTIN) 100 MG capsule Take 1 capsule (100 mg total) by mouth at bedtime. 07/20/14   Thao P Le, DO  glyBURIDE (DIABETA) 5 MG tablet Take 1 tablet (5 mg total) by mouth 2 (two) times daily with a meal. 07/20/14   Thao P Le, DO  hydrochlorothiazide (HYDRODIURIL) 25 MG tablet Take 1/2 tab po daily 07/20/14   Thao P Le, DO  lisinopril (PRINIVIL,ZESTRIL) 10 MG tablet Take 1 tablet (10 mg total) by mouth daily. 07/20/14   Thao P Le, DO  metFORMIN (GLUCOPHAGE) 1000 MG tablet Take 1 tablet (1,000 mg total) by mouth 2 (two) times daily with a meal. 07/20/14   Thao P Le, DO  omeprazole (PRILOSEC) 40 MG capsule Take 1 capsule (40 mg total) by mouth 2 (two) times daily. 08/08/14   Sherren Mocha, MD  sucralfate (CARAFATE) 1 G tablet Take 1 tablet (1 g total) by mouth 4 (four) times daily -  with meals and at bedtime. 08/08/14   Sherren Mocha, MD   BP 139/88 mmHg  Pulse 103  Temp(Src) 98.9 F (37.2 C) (Oral)  Resp 20  SpO2 98% Physical Exam  Constitutional: He is oriented to person, place, and time. He appears well-developed and well-nourished. No distress.  HENT:  Head: Normocephalic and atraumatic.  Eyes: Conjunctivae and EOM are normal.  Neck: Neck supple. No tracheal deviation present.  Cardiovascular: Normal rate.   Pulmonary/Chest:  Effort normal. No respiratory distress.  Musculoskeletal: He exhibits tenderness.  Tenderness over the left AC joint. Limited ROM due to pain at left elbow. Otherwise, NVI bilaterally.  Neurological: He is alert and oriented to person, place, and time.  Skin: Skin is warm and dry.  Psychiatric: He has a normal mood and affect. His behavior is normal.  Nursing note and vitals reviewed.   ED Course  Procedures   DIAGNOSTIC STUDIES: Oxygen Saturation is 98% on RA, normal by my interpretation.    COORDINATION OF CARE: 9:09 PM - Discussed treatment plan with pt at bedside which includes awaiting XR result. Treatment plan currently is to  follow up with orthopedist if pain persists. Will discharge home with Rx muscle relaxants. Will administer single dose of ibuprofen 800 mg here. Pt verbalized understanding and agreed to plan.   Imaging Review Dg Shoulder Left  07/31/2015  CLINICAL DATA:  Left shoulder pain after feeling a popping sensation while lifting weights today. EXAM: LEFT SHOULDER - 2+ VIEW COMPARISON:  None. FINDINGS: Mild greater tuberosity spur formation and minimal inferior glenohumeral spur formation. No fracture or dislocation. IMPRESSION: Mild degenerative changes.  No fracture or dislocation Electronically Signed   By: Beckie Salts M.D.   On: 07/31/2015 21:25   I have personally reviewed and evaluated these images and lab results as part of my medical decision-making.  MDM   Final diagnoses:  Left shoulder pain   Patient X-Ray negative for obvious fracture or dislocation. Pain managed in ED. Pt advised to follow up with orthopedics if symptoms persist for possibility of missed fracture diagnosis. Patient given brace while in ED, conservative therapy recommended and discussed. Patient will be dc home & is agreeable with above plan.   I personally performed the services described in this documentation, which was scribed in my presence. The recorded information has been reviewed and is accurate.     Melton Krebs, PA-C 08/02/15 1527  Cathren Laine, MD 08/05/15 [phone]

## 2015-08-14 LAB — HM DIABETES EYE EXAM

## 2015-08-21 ENCOUNTER — Encounter (HOSPITAL_COMMUNITY): Payer: Self-pay | Admitting: Neurology

## 2015-08-21 ENCOUNTER — Emergency Department (HOSPITAL_COMMUNITY)
Admission: EM | Admit: 2015-08-21 | Discharge: 2015-08-21 | Disposition: A | Discharge status: Home / Self Care | Payer: 59 | Attending: Emergency Medicine | Admitting: Emergency Medicine

## 2015-08-21 DIAGNOSIS — Z8739 Personal history of other diseases of the musculoskeletal system and connective tissue: Secondary | ICD-10-CM | POA: Diagnosis not present

## 2015-08-21 DIAGNOSIS — Z79899 Other long term (current) drug therapy: Secondary | ICD-10-CM | POA: Diagnosis not present

## 2015-08-21 DIAGNOSIS — J111 Influenza due to unidentified influenza virus with other respiratory manifestations: Secondary | ICD-10-CM | POA: Insufficient documentation

## 2015-08-21 DIAGNOSIS — Z7984 Long term (current) use of oral hypoglycemic drugs: Secondary | ICD-10-CM | POA: Diagnosis not present

## 2015-08-21 DIAGNOSIS — R69 Illness, unspecified: Secondary | ICD-10-CM

## 2015-08-21 DIAGNOSIS — E11319 Type 2 diabetes mellitus with unspecified diabetic retinopathy without macular edema: Secondary | ICD-10-CM | POA: Diagnosis not present

## 2015-08-21 DIAGNOSIS — I10 Essential (primary) hypertension: Secondary | ICD-10-CM | POA: Diagnosis not present

## 2015-08-21 DIAGNOSIS — R05 Cough: Secondary | ICD-10-CM | POA: Diagnosis present

## 2015-08-21 MED ORDER — OSELTAMIVIR PHOSPHATE 75 MG PO CAPS: 75.0000 mg | ORAL_CAPSULE | Freq: Two times a day (BID) | ORAL | Status: DC

## 2015-08-21 NOTE — Discharge Instructions (Signed)

## 2015-08-21 NOTE — ED Notes (Signed)
Pt reports sudden onset yesterday of body aches, chills, sore throat, cough. His girlfriend recently had the flu.

## 2015-08-21 NOTE — ED Provider Notes (Signed)
CSN: [id number]     Arrival date & time 08/21/15  1027 History  By signing my name below, I, Essence Howell, attest that this documentation has been prepared under the direction and in the presence of Teressa Lower, NP Electronically Signed: Charline Bills, ED Scribe 08/21/2015 at 10:49 AM.   Chief Complaint  Patient presents with  . Generalized Body Aches  . Cough  . Sore Throat   The history is provided by the patient. No language interpreter was used.   HPI Comments: Randall Morton is a 45 y.o. male, with a h/o HTN and DM, who presents to the Emergency Department complaining of generalized body aches onset yesterday. Pt's girlfriend was recently diagnosed with the flu. He reports associated mild fever of 99.1 F, chills, sore throat, cough, HA, nausea. No treatments tried PTA. No known medical allergies.   Past Medical History  Diagnosis Date  . Diabetes mellitus without complication (HCC)   . Hypertension   . Diabetic retinopathy (HCC)     last checked on 04/2014  . Osteomyelitis (HCC)     left foot ulcer--> osteo   History reviewed. No pertinent past surgical history. Family History  Problem Relation Age of Onset  . Asthma Mother   . Diabetes Father   . Heart disease Father   . Hyperlipidemia Father   . Hypertension Father   . Stroke Father   . Prostate cancer Paternal Grandfather    Social History  Substance Use Topics  . Smoking status: Never Smoker   . Smokeless tobacco: None  . Alcohol Use: 0.0 oz/week    0 Standard drinks or equivalent per week     Comment: occasionallly    Review of Systems  Constitutional: Positive for fever (mild) and chills.  HENT: Positive for sore throat.   Respiratory: Positive for cough.   Gastrointestinal: Positive for nausea.  Musculoskeletal: Positive for myalgias.  Neurological: Positive for headaches.  All other systems reviewed and are negative.  Allergies  Review of patient's allergies indicates no known allergies.  Home  Medications   Prior to Admission medications   Medication Sig Start Date End Date Taking? Authorizing Provider  gabapentin (NEURONTIN) 100 MG capsule Take 1 capsule (100 mg total) by mouth at bedtime. 07/20/14   Thao P Le, DO  glyBURIDE (DIABETA) 5 MG tablet Take 1 tablet (5 mg total) by mouth 2 (two) times daily with a meal. 07/20/14   Thao P Le, DO  hydrochlorothiazide (HYDRODIURIL) 25 MG tablet Take 1/2 tab po daily 07/20/14   Thao P Le, DO  ibuprofen (ADVIL,MOTRIN) 800 MG tablet Take 1 tablet (800 mg total) by mouth 3 (three) times daily. 07/31/15   Melton Krebs, PA-C  lisinopril (PRINIVIL,ZESTRIL) 10 MG tablet Take 1 tablet (10 mg total) by mouth daily. 07/20/14   Thao P Le, DO  metFORMIN (GLUCOPHAGE) 1000 MG tablet Take 1 tablet (1,000 mg total) by mouth 2 (two) times daily with a meal. 07/20/14   Thao P Le, DO  methocarbamol (ROBAXIN) 500 MG tablet Take 1 tablet (500 mg total) by mouth 2 (two) times daily. 07/31/15   Melton Krebs, PA-C  omeprazole (PRILOSEC) 40 MG capsule Take 1 capsule (40 mg total) by mouth 2 (two) times daily. 08/08/14   Sherren Mocha, MD  sucralfate (CARAFATE) 1 G tablet Take 1 tablet (1 g total) by mouth 4 (four) times daily -  with meals and at bedtime. 08/08/14   Sherren Mocha, MD   BP 860 460 2406  mmHg  Pulse 98  Temp(Src) 99.1 F (37.3 C) (Oral)  Resp 16  SpO2 95% Physical Exam  Constitutional: He is oriented to person, place, and time. He appears well-developed and well-nourished. No distress.  HENT:  Head: Normocephalic and atraumatic.  Right Ear: External ear normal.  Left Ear: External ear normal.  Nose: Mucosal edema present.  Mouth/Throat: Oropharyngeal exudate and posterior oropharyngeal edema present.  Eyes: Conjunctivae and EOM are normal.  Neck: Neck supple. No tracheal deviation present.  Cardiovascular: Normal rate.   Pulmonary/Chest: Effort normal. No respiratory distress. He has no wheezes.  Abdominal: Soft. Bowel sounds are normal. There is  no tenderness.  Musculoskeletal: Normal range of motion.  Neurological: He is alert and oriented to person, place, and time.  Skin: Skin is warm and dry.  Psychiatric: He has a normal mood and affect. His behavior is normal.  Nursing note and vitals reviewed.  ED Course  Procedures (including critical care time) DIAGNOSTIC STUDIES: Oxygen Saturation is 95% on RA, adequate by my interpretation.    COORDINATION OF CARE: 10:46 AM-Discussed treatment plan which includes Tamiflu with pt at bedside and pt agreed to plan.   Labs Review Labs Reviewed - No data to display  Imaging Review No results found.   EKG Interpretation None      MDM   Final diagnoses:  Influenza-like illness    Discussed use of tamiflu and return precautions. Doubt pneumonia  I personally performed the services described in this documentation, which was scribed in my presence. The recorded information has been reviewed and is accurate.    Teressa Lower, NP 08/21/15 1055  Lavera Guise, MD 08/21/15 [phone]

## 2015-08-29 ENCOUNTER — Other Ambulatory Visit: Payer: Self-pay | Admitting: Family Medicine

## 2015-10-10 ENCOUNTER — Encounter: Payer: Self-pay | Admitting: Family Medicine

## 2015-11-08 ENCOUNTER — Emergency Department (HOSPITAL_COMMUNITY)
Admission: EM | Admit: 2015-11-08 | Discharge: 2015-11-09 | Disposition: A | Discharge status: Home / Self Care | Payer: 59 | Attending: Emergency Medicine | Admitting: Emergency Medicine

## 2015-11-08 ENCOUNTER — Encounter (HOSPITAL_COMMUNITY): Payer: Self-pay | Admitting: Emergency Medicine

## 2015-11-08 ENCOUNTER — Ambulatory Visit (INDEPENDENT_AMBULATORY_CARE_PROVIDER_SITE_OTHER)
Admission: EM | Admit: 2015-11-08 | Discharge: 2015-11-08 | Disposition: A | Discharge status: Home / Self Care | Payer: 59 | Source: Home / Self Care | Attending: Emergency Medicine | Admitting: Emergency Medicine

## 2015-11-08 DIAGNOSIS — B351 Tinea unguium: Secondary | ICD-10-CM

## 2015-11-08 DIAGNOSIS — Z791 Long term (current) use of non-steroidal anti-inflammatories (NSAID): Secondary | ICD-10-CM | POA: Insufficient documentation

## 2015-11-08 DIAGNOSIS — Z7984 Long term (current) use of oral hypoglycemic drugs: Secondary | ICD-10-CM | POA: Insufficient documentation

## 2015-11-08 DIAGNOSIS — I10 Essential (primary) hypertension: Secondary | ICD-10-CM

## 2015-11-08 DIAGNOSIS — M7989 Other specified soft tissue disorders: Secondary | ICD-10-CM | POA: Diagnosis not present

## 2015-11-08 DIAGNOSIS — Z79899 Other long term (current) drug therapy: Secondary | ICD-10-CM

## 2015-11-08 DIAGNOSIS — E11319 Type 2 diabetes mellitus with unspecified diabetic retinopathy without macular edema: Secondary | ICD-10-CM | POA: Insufficient documentation

## 2015-11-08 LAB — COMPREHENSIVE METABOLIC PANEL
ALBUMIN: 3.4 g/dL — AB (ref 3.5–5.0)
ALT: 25 U/L (ref 17–63)
AST: 19 U/L (ref 15–41)
Alkaline Phosphatase: 88 U/L (ref 38–126)
Anion gap: 7 (ref 5–15)
BUN: 10 mg/dL (ref 6–20)
CHLORIDE: 103 mmol/L (ref 101–111)
CO2: 26 mmol/L (ref 22–32)
CREATININE: 0.99 mg/dL (ref 0.61–1.24)
Calcium: 9.2 mg/dL (ref 8.9–10.3)
GFR calc Af Amer: >60 mL/min (ref >60)
GFR calc non Af Amer: >60 mL/min (ref >60)
GLUCOSE: 267 mg/dL — AB (ref 65–99)
Potassium: 4.1 mmol/L (ref 3.5–5.1)
SODIUM: 136 mmol/L (ref 135–145)
Total Bilirubin: 0.7 mg/dL (ref 0.3–1.2)
Total Protein: 7.7 g/dL (ref 6.5–8.1)

## 2015-11-08 LAB — CBC WITH DIFFERENTIAL/PLATELET
BASOS ABS: 0 10*3/uL (ref 0.0–0.1)
Basophils Relative: 0 %
EOS ABS: 0.4 10*3/uL (ref 0.0–0.7)
EOS PCT: 4 %
HCT: 42.5 % (ref 39.0–52.0)
Hemoglobin: 15 g/dL (ref 13.0–17.0)
Lymphocytes Relative: 41 %
Lymphs Abs: 4.4 10*3/uL — ABNORMAL HIGH (ref 0.7–4.0)
MCH: 30.3 pg (ref 26.0–34.0)
MCHC: 35.3 g/dL (ref 30.0–36.0)
MCV: 85.9 fL (ref 78.0–100.0)
Monocytes Absolute: 0.9 10*3/uL (ref 0.1–1.0)
Monocytes Relative: 8 %
NEUTROS PCT: 47 %
Neutro Abs: 5.2 10*3/uL (ref 1.7–7.7)
PLATELETS: 247 10*3/uL (ref 150–400)
RBC: 4.95 MIL/uL (ref 4.22–5.81)
RDW: 12.8 % (ref 11.5–15.5)
WBC: 10.9 10*3/uL — AB (ref 4.0–10.5)

## 2015-11-08 LAB — D-DIMER, QUANTITATIVE (NOT AT ARMC): D DIMER QUANT: 0.92 ug{FEU}/mL — AB (ref 0.00–0.50)

## 2015-11-08 MED ORDER — ENOXAPARIN SODIUM 150 MG/ML ~~LOC~~ SOLN
1.0000 mg/kg | Freq: Once | SUBCUTANEOUS | Status: AC
  Administered 2015-11-09: 140 mg via SUBCUTANEOUS
  Filled 2015-11-08: qty 0.93

## 2015-11-08 NOTE — ED Notes (Signed)
[facility] reports worsening right lower leg swelling for several weeks , denies injury /ambulatory , blood tests done at urgent care D-dimer is elevated = 0.92 . Denies SOB .

## 2015-11-08 NOTE — ED Provider Notes (Signed)
CSN: [id number]     Arrival date & time 11/08/15  2113 History   First MD Initiated Contact with Patient 11/08/15 2119     Chief Complaint  Patient presents with  . Leg Swelling     (Consider location/radiation/quality/duration/timing/severity/associated sxs/prior Treatment) HPI   [name] with atraumatic RLE swelling.  He is not sure of exact onset. Months? Worse in last several days to week. Not painful, but feels heavy. No rash. No numbness or tingling. No cough. No sob. No orthopnea.   Past Medical History  Diagnosis Date  . Diabetes mellitus without complication (HCC)   . Hypertension   . Diabetic retinopathy (HCC)     last checked on 04/2014  . Osteomyelitis (HCC)     left foot ulcer--> osteo   History reviewed. No pertinent past surgical history. Family History  Problem Relation Age of Onset  . Asthma Mother   . Diabetes Father   . Heart disease Father   . Hyperlipidemia Father   . Hypertension Father   . Stroke Father   . Prostate cancer Paternal Grandfather    Social History  Substance Use Topics  . Smoking status: Never Smoker   . Smokeless tobacco: None  . Alcohol Use: 0.0 oz/week    0 Standard drinks or equivalent per week    Review of Systems  All systems reviewed and negative, other than as noted in HPI.   Allergies  Review of patient's allergies indicates no known allergies.  Home Medications   Prior to Admission medications   Medication Sig Start Date End Date Taking? Authorizing Provider  FLAXSEED, LINSEED, PO Take 5 mLs by mouth 2 (two) times daily.   Yes Historical Provider, MD  glyBURIDE (DIABETA) 5 MG tablet TAKE ONE TABLET BY MOUTH TWICE DAILY WITH MEALS 08/31/15  Yes Morrell RiddleSarah L Weber, PA-C  ibuprofen (ADVIL,MOTRIN) 800 MG tablet Take 1 tablet (800 mg total) by mouth 3 (three) times daily. Patient taking differently: Take 800 mg by mouth every 8 (eight) hours as needed for mild pain.  07/31/15  Yes Melton KrebsSamantha Nicole Riley, PA-C  L-ARGININE PO Take 1  capsule by mouth 2 (two) times daily.   Yes Historical Provider, MD  lisinopril (PRINIVIL,ZESTRIL) 10 MG tablet TAKE ONE TABLET BY MOUTH DAILY 08/31/15  Yes Morrell RiddleSarah L Weber, PA-C  metFORMIN (GLUCOPHAGE) 1000 MG tablet TAKE ONE TABLET BY MOUTH TWICE DAILY WITH MEALS 08/31/15  Yes Morrell RiddleSarah L Weber, PA-C  multivitamin (ONE-A-DAY MEN'S) TABS tablet Take 1 tablet by mouth daily.   Yes Historical Provider, MD  OVER THE COUNTER MEDICATION Tribulus: Take 1 capsule by mouth nightly   Yes Historical Provider, MD  gabapentin (NEURONTIN) 100 MG capsule Take 1 capsule (100 mg total) by mouth at bedtime. 07/20/14   Thao P Le, DO  hydrochlorothiazide (HYDRODIURIL) 25 MG tablet Take 1/2 tab po daily 07/20/14   Thao P Le, DO  methocarbamol (ROBAXIN) 500 MG tablet Take 1 tablet (500 mg total) by mouth 2 (two) times daily. 07/31/15   Melton KrebsSamantha Nicole Riley, PA-C  omeprazole (PRILOSEC) 40 MG capsule Take 1 capsule (40 mg total) by mouth 2 (two) times daily. 08/08/14   Sherren MochaEva N Shaw, MD  oseltamivir (TAMIFLU) 75 MG capsule Take 1 capsule (75 mg total) by mouth every 12 (twelve) hours. 08/21/15   Teressa LowerVrinda Pickering, NP  sucralfate (CARAFATE) 1 G tablet Take 1 tablet (1 g total) by mouth 4 (four) times daily -  with meals and at bedtime. 08/08/14   Sherren MochaEva N Shaw, MD  BP 139/75 mmHg  Pulse 100  Temp(Src) 99 F (37.2 C) (Oral)  Resp 18  Ht  (1.88 m)  Wt 309 lb (140.161 kg)  BMI 39.66 kg/m2  SpO2 99% Physical Exam  Constitutional: He appears well-developed and well-nourished. No distress.  HENT:  Head: Normocephalic and atraumatic.  Eyes: Conjunctivae are normal. Right eye exhibits no discharge. Left eye exhibits no discharge.  Neck: Neck supple.  Cardiovascular: Normal rate, regular rhythm and normal heart sounds.  Exam reveals no gallop and no friction rub.   No murmur heard. Pulmonary/Chest: Effort normal and breath sounds normal. No respiratory distress.  Abdominal: Soft. He exhibits no distension. There is no tenderness.   Musculoskeletal: He exhibits no edema or tenderness.  RLE swelling. Pitting edema. No calf tenderness. Palpable DP pulse.   Neurological: He is alert.  Skin: Skin is warm and dry.  Psychiatric: He has a normal mood and affect. His behavior is normal. Thought content normal.  Nursing note and vitals reviewed.   ED Course  Procedures (including critical care time) Labs Review Labs Reviewed - No data to display  Imaging Review No results found. I have personally reviewed and evaluated these images and lab results as part of my medical decision-making.   EKG Interpretation None      MDM   Final diagnoses:  Right leg swelling    45 year old male with atraumatic right lower extremity swelling. Urgent Care testing significant for elevated d-dimer. Sent to the emergency room for ultrasound to evaluate for possible DVT. Unfortunately, ultrasound unavailable at this hour. He'll be given a dose of Lovenox empirically. He'll return tomorrow for this testing. Mildly tachycardic but he has no specific complaints concerning for possible PE.     Raeford Razor, MD 11/16/15 [phone]

## 2015-11-08 NOTE — Discharge Instructions (Signed)
With the swelling only in one leg, the concern is for a blood clot. We are doing some blood work.  I will call you with the results later this evening. If the d-dimer is elevated, you will need to go to the emergency room for additional evaluation. If it is normal, I will call in some medication to help with the swelling. Follow-up with your primary care provider as needed. Keep your appointment with the podiatrist for your toenails.

## 2015-11-08 NOTE — ED Notes (Signed)
Right lower leg and foot swelling.  Patient reports this has been an intermittent problem for 2 weeks.  Patient has no known injury

## 2015-11-08 NOTE — ED Provider Notes (Addendum)
CSN: [id number]     Arrival date & time 11/08/15  1853 History   First MD Initiated Contact with Patient 11/08/15 1926     No chief complaint on file. CC: right leg swelling  (Consider location/radiation/quality/duration/timing/severity/associated sxs/prior Treatment) HPI He is a 45 year old man here for evaluation of right leg swelling. He states this is been an ongoing problem, but has been worse over the last week or so. He states he does work on his feet all day. He denies any swelling in the left leg. There is no redness or pain associated with the swelling.  He is also concerned about his toenails as they are not very attractive. For the last 2 days he has felt poorly. He describes this as feeling somewhat nauseated.  Past Medical History  Diagnosis Date  . Diabetes mellitus without complication (HCC)   . Hypertension   . Diabetic retinopathy (HCC)     last checked on 04/2014  . Osteomyelitis (HCC)     left foot ulcer--> osteo   No past surgical history on file. Family History  Problem Relation Age of Onset  . Asthma Mother   . Diabetes Father   . Heart disease Father   . Hyperlipidemia Father   . Hypertension Father   . Stroke Father   . Prostate cancer Paternal Grandfather    Social History  Substance Use Topics  . Smoking status: Never Smoker   . Smokeless tobacco: Not on file  . Alcohol Use: 0.0 oz/week    0 Standard drinks or equivalent per week     Comment: occasionallly    Review of Systems As in history of present illness Allergies  Review of patient's allergies indicates no known allergies.  Home Medications   Prior to Admission medications   Medication Sig Start Date End Date Taking? Authorizing Provider  gabapentin (NEURONTIN) 100 MG capsule Take 1 capsule (100 mg total) by mouth at bedtime. 07/20/14   Thao P Le, DO  glyBURIDE (DIABETA) 5 MG tablet TAKE ONE TABLET BY MOUTH TWICE DAILY WITH MEALS 08/31/15   Morrell Riddle, PA-C  hydrochlorothiazide  (HYDRODIURIL) 25 MG tablet Take 1/2 tab po daily 07/20/14   Thao P Le, DO  ibuprofen (ADVIL,MOTRIN) 800 MG tablet Take 1 tablet (800 mg total) by mouth 3 (three) times daily. 07/31/15   Melton Krebs, PA-C  lisinopril (PRINIVIL,ZESTRIL) 10 MG tablet TAKE ONE TABLET BY MOUTH DAILY 08/31/15   Morrell Riddle, PA-C  metFORMIN (GLUCOPHAGE) 1000 MG tablet TAKE ONE TABLET BY MOUTH TWICE DAILY WITH MEALS 08/31/15   Morrell Riddle, PA-C  methocarbamol (ROBAXIN) 500 MG tablet Take 1 tablet (500 mg total) by mouth 2 (two) times daily. 07/31/15   Melton Krebs, PA-C  omeprazole (PRILOSEC) 40 MG capsule Take 1 capsule (40 mg total) by mouth 2 (two) times daily. 08/08/14   Sherren Mocha, MD  oseltamivir (TAMIFLU) 75 MG capsule Take 1 capsule (75 mg total) by mouth every 12 (twelve) hours. 08/21/15   Teressa Lower, NP  sucralfate (CARAFATE) 1 G tablet Take 1 tablet (1 g total) by mouth 4 (four) times daily -  with meals and at bedtime. 08/08/14   Sherren Mocha, MD   Meds Ordered and Administered this Visit  Medications - No data to display  BP 139/96 mmHg  Pulse 102  Temp(Src) 97.8 F (36.6 C) (Oral)  Resp 16  SpO2 100% No data found.   Physical Exam  Constitutional: He is oriented to person,  place, and time. He appears well-developed and well-nourished. No distress.  Cardiovascular: Normal rate.   Pulmonary/Chest: Effort normal.  Musculoskeletal:  Right leg: He has 3+ pitting edema to the knee. There is no redness. No calf tenderness or palpable cords. I am unable to palpate pulses due to the edema, but he has brisk cap refill. Left leg: No swelling.  Neurological: He is alert and oriented to person, place, and time.    ED Course  Procedures (including critical care time)  Labs Review Labs Reviewed  D[facility], QUANTITAT[facility])  CBC WITH DIFFERENTIAL/PLATELET  COMPREHENSIVE METABOLIC PANEL    Imaging Review No results found.   MDM   1. Right leg swelling    Concern is for  DVT. He has no erythema or pain, but does have significant swelling. This is been ongoing for several weeks. Discussed options with patient and his partner. We will check a d-dimer. Patient would like to leave to get some food. I will call them with the results.  If this is positive, I will send him to the emergency room. If negative, I will call in Lasix to his pharmacy.  He does also have onychomycosis. He has an appointment with [facility] on May 31.   Charm RingsErin J Zakariye Nee, MD 11/08/15 2009  D-dimer came back elevated.  CBC and CMP notable for elevated glucose at 267.  Discussed results with patient and recommended that he go to the ER for DVT evaluation.  He expressed understanding and will head to the ER.  Charm RingsErin J Ondra Deboard, MD 11/08/15 [phone]

## 2015-11-09 ENCOUNTER — Ambulatory Visit (HOSPITAL_COMMUNITY)
Admission: EM | Admit: 2015-11-09 | Discharge: 2015-11-09 | Disposition: A | Discharge status: Home / Self Care | Payer: 59 | Attending: Emergency Medicine | Admitting: Emergency Medicine

## 2015-11-09 ENCOUNTER — Encounter (HOSPITAL_COMMUNITY): Payer: Self-pay | Admitting: Emergency Medicine

## 2015-11-09 ENCOUNTER — Ambulatory Visit (HOSPITAL_BASED_OUTPATIENT_CLINIC_OR_DEPARTMENT_OTHER)
Admission: RE | Admit: 2015-11-09 | Discharge: 2015-11-09 | Disposition: A | Discharge status: Home / Self Care | Payer: 59 | Source: Ambulatory Visit | Attending: Emergency Medicine | Admitting: Emergency Medicine

## 2015-11-09 ENCOUNTER — Telehealth (HOSPITAL_COMMUNITY): Payer: Self-pay | Admitting: Emergency Medicine

## 2015-11-09 DIAGNOSIS — M79604 Pain in right leg: Secondary | ICD-10-CM | POA: Insufficient documentation

## 2015-11-09 DIAGNOSIS — M7989 Other specified soft tissue disorders: Secondary | ICD-10-CM

## 2015-11-09 DIAGNOSIS — R590 Localized enlarged lymph nodes: Secondary | ICD-10-CM

## 2015-11-09 MED ORDER — FUROSEMIDE 40 MG PO TABS: 40.0000 mg | ORAL_TABLET | Freq: Every day | ORAL | Status: DC

## 2015-11-09 MED ORDER — BENZONATATE 100 MG PO CAPS: 100.0000 mg | ORAL_CAPSULE | Freq: Three times a day (TID) | ORAL | Status: DC

## 2015-11-09 NOTE — ED Notes (Signed)
See notation from earlier today 1:56 pm

## 2015-11-09 NOTE — ED Provider Notes (Signed)
CSN: [id number]     Arrival date & time 11/09/15  1614 History   First MD Initiated Contact with Patient 11/09/15 1758     Chief Complaint  Patient presents with  . Cough  . Leg Swelling   (Consider location/radiation/quality/duration/timing/severity/associated sxs/prior Treatment) HPI History obtained from patient:  Pt presents with the cc of: Right leg swelling Duration of symptoms: 2 days Treatment prior to arrival: Had blood work done in urgent care and ultrasound done to the emergency department. Context: Sudden onset of swelling in his right leg. Other symptoms include: Concerned about toenails Pain score: 0 FAMILY HISTORY: Father died of diabetic complications age 47    Past Medical History  Diagnosis Date  . Diabetes mellitus without complication (HCC)   . Hypertension   . Diabetic retinopathy (HCC)     last checked on 04/2014  . Osteomyelitis (HCC)     left foot ulcer--> osteo   History reviewed. No pertinent past surgical history. Family History  Problem Relation Age of Onset  . Asthma Mother   . Diabetes Father   . Heart disease Father   . Hyperlipidemia Father   . Hypertension Father   . Stroke Father   . Prostate cancer Paternal Grandfather    Social History  Substance Use Topics  . Smoking status: Never Smoker   . Smokeless tobacco: None  . Alcohol Use: 0.0 oz/week    0 Standard drinks or equivalent per week    Review of Systems  Denies: HEADACHE, NAUSEA, ABDOMINAL PAIN, CHEST PAIN, CONGESTION, DYSURIA, SHORTNESS OF BREATH  Allergies  Review of patient's allergies indicates no known allergies.  Home Medications   Prior to Admission medications   Medication Sig Start Date End Date Taking? Authorizing Provider  FLAXSEED, LINSEED, PO Take 5 mLs by mouth 2 (two) times daily.    Historical Provider, MD  furosemide (LASIX) 40 MG tablet Take 1 tablet (40 mg total) by mouth daily. 11/09/15   Tharon AquasFrank C Patrick, PA  gabapentin (NEURONTIN) 100 MG capsule  Take 1 capsule (100 mg total) by mouth at bedtime. 07/20/14   Thao P Le, DO  glyBURIDE (DIABETA) 5 MG tablet TAKE ONE TABLET BY MOUTH TWICE DAILY WITH MEALS 08/31/15   Morrell RiddleSarah L Weber, PA-C  hydrochlorothiazide (HYDRODIURIL) 25 MG tablet Take 1/2 tab po daily 07/20/14   Thao P Le, DO  ibuprofen (ADVIL,MOTRIN) 800 MG tablet Take 1 tablet (800 mg total) by mouth 3 (three) times daily. Patient taking differently: Take 800 mg by mouth every 8 (eight) hours as needed for mild pain.  07/31/15   Melton KrebsSamantha Nicole Riley, PA-C  L-ARGININE PO Take 1 capsule by mouth 2 (two) times daily.    Historical Provider, MD  lisinopril (PRINIVIL,ZESTRIL) 10 MG tablet TAKE ONE TABLET BY MOUTH DAILY 08/31/15   Morrell RiddleSarah L Weber, PA-C  metFORMIN (GLUCOPHAGE) 1000 MG tablet TAKE ONE TABLET BY MOUTH TWICE DAILY WITH MEALS 08/31/15   Morrell RiddleSarah L Weber, PA-C  methocarbamol (ROBAXIN) 500 MG tablet Take 1 tablet (500 mg total) by mouth 2 (two) times daily. 07/31/15   Melton KrebsSamantha Nicole Riley, PA-C  multivitamin (ONE-A-DAY MEN'S) TABS tablet Take 1 tablet by mouth daily.    Historical Provider, MD  omeprazole (PRILOSEC) 40 MG capsule Take 1 capsule (40 mg total) by mouth 2 (two) times daily. 08/08/14   Sherren MochaEva N Shaw, MD  oseltamivir (TAMIFLU) 75 MG capsule Take 1 capsule (75 mg total) by mouth every 12 (twelve) hours. 08/21/15   Teressa LowerVrinda Pickering, NP  OVER  THE COUNTER MEDICATION Tribulus: Take 1 capsule by mouth nightly    Historical Provider, MD  sucralfate (CARAFATE) 1 G tablet Take 1 tablet (1 g total) by mouth 4 (four) times daily -  with meals and at bedtime. 08/08/14   Sherren Mocha, MD   Meds Ordered and Administered this Visit  Medications - No data to display  BP 139/92 mmHg  Pulse 97  Temp(Src) 98.2 F (36.8 C) (Oral)  Resp 16  SpO2 98% No data found.   Physical Exam NURSES NOTES AND VITAL SIGNS REVIEWED. CONSTITUTIONAL: Well developed, well nourished, no acute distress HEENT: normocephalic, atraumatic EYES: Conjunctiva normal NECK:normal  ROM, supple, no adenopathy PULMONARY:No respiratory distress, normal effort ABDOMINAL: Soft, ND, NT BS+, No CVAT MUSCULOSKELETAL: Normal ROM of all extremities, PITTING Edema up to mid thigh right leg only. SKIN: warm and dry without rash PSYCHIATRIC: Mood and affect, behavior are normal  ED Course  Procedures (including critical care time)  Labs Review Labs Reviewed - No data to display  Imaging Review No results found.   Visual Acuity Review  Right Eye Distance:   Left Eye Distance:   Bilateral Distance:    Right Eye Near:   Left Eye Near:    Bilateral Near:     Prescription for furosemide 40 mg daily leg elevation at home. Make sure he finds a new primary care provider and has hemoglobin A1c sorted. Total Visit Time: 45 MINUTES "GREATER THAN 50% WAS SPENT IN COUNSELING AND COORDINATION OF CARE WITH THE PATIENT" DISCUSSION OF DIABETES AND ITS COMPLICATIONS, HGBA1-C, FOLLOW UP TREATMENT, REVIEW OF LABS AND ULTRASOUND.   MDM   1. Leg swelling     Patient is reassured that there are no issues that require transfer to higher level of care at this time or additional tests. Patient is advised to continue home symptomatic treatment. Patient is advised that if there are new or worsening symptoms to attend the emergency department, contact primary care provider, or return to [facility]. Instructions of care provided discharged home in stable condition.    THIS NOTE WAS GENERATED USING A VOICE RECOGNITION SOFTWARE PROGRAM. ALL REASONABLE EFFORTS  WERE MADE TO PROOFREAD THIS DOCUMENT FOR ACCURACY.  I have verbally reviewed the discharge instructions with the patient. A printed AVS was given to the patient.  All questions were answered prior to discharge.      Tharon Aquas, PA 11/09/15 1931

## 2015-11-09 NOTE — Progress Notes (Signed)
*  PRELIMINARY RESULTS* Vascular Ultrasound Right lower extremity venous duplex has been completed.  Preliminary findings: No evidence of DVT or baker's cyst. Enlarged right inguinal lymph node measuring 5.6 cm is noted.    Farrel DemarkJill Eunice, RDMS, RVT  11/09/2015, 9:08 AM

## 2015-11-09 NOTE — Discharge Instructions (Signed)
Edema °Edema is an abnormal buildup of fluids. It is more common in your legs and thighs. Painless swelling of the feet and ankles is more likely as a person ages. It also is common in looser skin, like around your eyes. °HOME CARE  °· Keep the affected body part above the level of the heart while lying down. °· Do not sit still or stand for a long time. °· Do not put anything right under your knees when you lie down. °· Do not wear tight clothes on your upper legs. °· Exercise your legs to help the puffiness (swelling) go down. °· Wear elastic bandages or support stockings as told by your doctor. °· A low-salt diet may help lessen the puffiness. °· Only take medicine as told by your doctor. °GET HELP IF: °· Treatment is not working. °· You have heart, liver, or kidney disease and notice that your skin looks puffy or shiny. °· You have puffiness in your legs that does not get better when you raise your legs. °· You have sudden weight gain for no reason. °GET HELP RIGHT AWAY IF:  °· You have shortness of breath or chest pain. °· You cannot breathe when you lie down. °· You have pain, redness, or warmth in the areas that are puffy. °· You have heart, liver, or kidney disease and get edema all of a sudden. °· You have a fever and your symptoms get worse all of a sudden. °MAKE SURE YOU:  °· Understand these instructions. °· Will watch your condition. °· Will get help right away if you are not doing well or get worse. °  °This information is not intended to replace advice given to you by your health care provider. Make sure you discuss any questions you have with your health care provider. °  °Document Released: 11/26/2007 Document Revised: 06/14/2013 Document Reviewed: 04/01/2013 °Elsevier Interactive Patient Education ©2016 [employer] ° °

## 2015-11-09 NOTE — ED Notes (Addendum)
Patient arrived at [facility] with questions.  Explained to patient that Dr Piedad ClimesHonig not here today.  Patient is not having any different symptoms than yesterday.  Patient denies sob.  Encouraged patient to call pcp (out on maternity leave) and talk to whoever is covering for pcp.  Also suggested patient could call ED to find out what is the next step based on test results.  (incomplete note from ED).  [facility]  Multiple times instructed patient follow up in ED directly if any worsening of symptoms:  Specifically feeling bad, chest pain, sob.    Discussed patient's case with Dr Mauricio PoBreen.  Agreed with options provided to patient

## 2015-11-13 ENCOUNTER — Telehealth: Payer: Self-pay | Admitting: *Deleted

## 2015-11-13 ENCOUNTER — Ambulatory Visit (INDEPENDENT_AMBULATORY_CARE_PROVIDER_SITE_OTHER): Payer: 59

## 2015-11-13 ENCOUNTER — Ambulatory Visit (INDEPENDENT_AMBULATORY_CARE_PROVIDER_SITE_OTHER): Payer: 59 | Admitting: Podiatry

## 2015-11-13 ENCOUNTER — Encounter: Payer: Self-pay | Admitting: Podiatry

## 2015-11-13 VITALS — BP 118/76 | HR 100 | Resp 18

## 2015-11-13 DIAGNOSIS — B351 Tinea unguium: Secondary | ICD-10-CM

## 2015-11-13 DIAGNOSIS — E1142 Type 2 diabetes mellitus with diabetic polyneuropathy: Secondary | ICD-10-CM | POA: Diagnosis not present

## 2015-11-13 DIAGNOSIS — E119 Type 2 diabetes mellitus without complications: Secondary | ICD-10-CM

## 2015-11-13 DIAGNOSIS — M1460 Charcot's joint, unspecified site: Secondary | ICD-10-CM

## 2015-11-13 DIAGNOSIS — M79676 Pain in unspecified toe(s): Secondary | ICD-10-CM

## 2015-11-13 NOTE — Telephone Encounter (Signed)
Pt states he was prescribed lasix for the swelling in his foot, and Dr. Al CorpusHyatt states the swelling is due to problem in the foot.  Pt states he has about 20 left and no refills.  I told pt if he continued Lasix it may decrease some of the swelling and decrease how the foot rubs in the shoe.  Pt states understanding.

## 2015-11-13 NOTE — Progress Notes (Signed)
   Subjective:    Patient ID: Randall PrestoCarl S Meader, male    DOB: 11/29/1970, 45 y.o.   MRN: [id number]  HPI: He presents today with a chief complaint of a swollen throbbing foot right. He states that it is swollen and painful to some degree for the past several weeks he went to the emergency department record him on Lasix and performed a venous Doppler for the swelling in his leg. He was negative for DVT but they suggest that he continue the Lasix and follow-up with podiatry. He has had diabetes for many years hemoglobin A1c minimally controlled at 7.6 he denies any trauma to the foot.    Review of Systems  Respiratory: Positive for cough.   Cardiovascular: Positive for leg swelling.  Musculoskeletal: Positive for arthralgias.  Skin:       Change in nails  Hematological: Positive for adenopathy.  All other systems reviewed and are negative.      Objective:   Physical Exam: Vital signs are stable alert and oriented 3. Pulses are palpable. Neurologic sensorium is diminished bilaterally. Deep tendon reflexes are intact and muscle strength is normal bilateral. Warm swollen right foot with a flaccid calf no tenderness in the calf. The put the foot is flattened in comparison to the contralateral foot. Radiographs do demonstrate breakdown of the midfoot with Charcot deformity and flattening of the foot. No open lesions or wounds. His toenails are thick yellow dystrophic onychomycotic.        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 Pain limb secondary to onychomycosis.  Plan: Debridement of toenails 1 through 5 bilateral. Placed him limited weightbearing cam walker right with the buildup arch. He is to not walk at work for the next 6-8 weeks. I will follow-up with him in 4 weeks for another set of x-rays awaiting consolidation of sharp and the

## 2015-11-15 ENCOUNTER — Ambulatory Visit: Payer: 59 | Admitting: Podiatry

## 2015-11-15 ENCOUNTER — Telehealth: Payer: Self-pay | Admitting: *Deleted

## 2015-11-15 ENCOUNTER — Telehealth: Payer: Self-pay | Admitting: Podiatry

## 2015-11-15 ENCOUNTER — Encounter (HOSPITAL_COMMUNITY): Payer: Self-pay | Admitting: *Deleted

## 2015-11-15 ENCOUNTER — Ambulatory Visit (HOSPITAL_COMMUNITY)
Admission: EM | Admit: 2015-11-15 | Discharge: 2015-11-15 | Disposition: A | Discharge status: Home / Self Care | Payer: 59 | Attending: Family Medicine | Admitting: Family Medicine

## 2015-11-15 DIAGNOSIS — S91119A Laceration without foreign body of unspecified toe without damage to nail, initial encounter: Secondary | ICD-10-CM

## 2015-11-15 DIAGNOSIS — E1142 Type 2 diabetes mellitus with diabetic polyneuropathy: Secondary | ICD-10-CM

## 2015-11-15 DIAGNOSIS — R600 Localized edema: Secondary | ICD-10-CM

## 2015-11-15 DIAGNOSIS — Z23 Encounter for immunization: Secondary | ICD-10-CM

## 2015-11-15 MED ORDER — TETANUS-DIPHTH-ACELL PERTUSSIS 5-2.5-18.5 LF-MCG/0.5 IM SUSP
0.5000 mL | Freq: Once | INTRAMUSCULAR | Status: AC
  Administered 2015-11-15: 0.5 mL via INTRAMUSCULAR

## 2015-11-15 MED ORDER — TETANUS-DIPHTH-ACELL PERTUSSIS 5-2.5-18.5 LF-MCG/0.5 IM SUSP
INTRAMUSCULAR | Status: AC
  Filled 2015-11-15: qty 0.5

## 2015-11-15 MED ORDER — CEPHALEXIN 500 MG PO CAPS: 500.0000 mg | ORAL_CAPSULE | Freq: Four times a day (QID) | ORAL | Status: DC

## 2015-11-15 MED ORDER — BACITRACIN ZINC 500 UNIT/GM EX OINT
TOPICAL_OINTMENT | CUTANEOUS | Status: AC
Start: 2015-11-15 — End: 2015-11-15
  Filled 2015-11-15: qty 2.7

## 2015-11-15 NOTE — Telephone Encounter (Signed)
Patient's family member call the on-call number on May 24 at 10:30 PM stating the patient has cut his foot early in the day and he is on blood thinners and he is still bleeding. I recommended the patient to the emergency room. She states that she put him in the bathtub with cold water. Recommended elevation and a light compression wrap. Call if any further questions but encouraged them to go to the emergency room/urgent care.

## 2015-11-15 NOTE — ED Notes (Signed)
Needs     A  Tetanus   Shot        Pt   Reports      He  Has   An  appt     With  A  Podiatrist  Today         He  Is  Wearing  A   Cam  Walker     And  He    Sustained  A  Superficial lac   To      r 2nd  Toe        he  Is  Here  Today  For  A  Tet    Shot

## 2015-11-15 NOTE — Telephone Encounter (Signed)
Pts family member called and said the doctor on call last night told them they need to be seen today due to being a diabetic. I spoke to Dr Al CorpusHyatt and Milton FergusonVal and they recommended pt go to the er and be seen and get a tetanus shot. They did not go to urgent care or er like Dr Ardelle AntonWagoner recommended. Dr Al CorpusHyatt states he would see pt but he still needs to

## 2015-11-15 NOTE — Telephone Encounter (Signed)
Patient called to enquire about his last TD shot. Our records show he last received one January 1995. He will notify his PCP of this. Wendall MolaJacqueline Ethelyne Erich

## 2015-11-15 NOTE — Telephone Encounter (Signed)
Pt still needs to make sure he has tetanus shot done. They have opted to come see Dr Al CorpusHyatt at 945am today. I also explained that after so many hours there is not a lot to be done with a laceration.

## 2015-11-15 NOTE — ED Provider Notes (Signed)
CSN: [id number]     Arrival date & time 11/15/15  1331 History   First MD Initiated Contact with Patient 11/15/15 1426     Chief Complaint  Patient presents with  . Immunizations   (Consider location/radiation/quality/duration/timing/severity/associated sxs/prior Treatment) HPI Comments: 45 year old male presented to the urgent care requesting a tetanus toxoid booster. He states that he has a wound to his right second toe. He had made an appointment for this afternoon with a podiatrist for management of his foot wound. While waiting to be seen to obtain his tetanus toxoid in the urgent care he missed his appointment with the podiatrist as now requesting that we manage his foot injury.  The patient reports that he had been seen in the urgent care in the emergency department for swelling and pain of the right lower extremity below the knee. He recently received the protocol for possible DVT in which he was administered an anticoagulant subcutaneously followed by venous Doppler the next day. Patient reports this was negative. He was advised that he had musculoskeletal deformities of the foot and lower extremity along with possible circulatory problems which is causing his symptoms.  Last p.m. he was barefoot in his home and he stepped on what he believes to be a portion of the earring. This produced a  circumferential laceration across the plantar aspect at the base of the second toe.    Past Medical History  Diagnosis Date  . Diabetes mellitus without complication (HCC)   . Hypertension   . Diabetic retinopathy (HCC)     last checked on 04/2014  . Osteomyelitis (HCC)     left foot ulcer--> osteo   History reviewed. No pertinent past surgical history. Family History  Problem Relation Age of Onset  . Asthma Mother   . Diabetes Father   . Heart disease Father   . Hyperlipidemia Father   . Hypertension Father   . Stroke Father   . Prostate cancer Paternal Grandfather    Social History   Substance Use Topics  . Smoking status: Never Smoker   . Smokeless tobacco: None  . Alcohol Use: 0.0 oz/week    0 Standard drinks or equivalent per week    Review of Systems  Constitutional: Positive for activity change. Negative for fever and fatigue.  HENT: Negative.   Respiratory: Negative.   Cardiovascular: Negative.   Gastrointestinal: Negative.   Musculoskeletal:       Chronic right lower leg pain  Skin: Positive for wound.  Neurological: Negative.     Allergies  Review of patient's allergies indicates no known allergies.  Home Medications   Prior to Admission medications   Medication Sig Start Date End Date Taking? Authorizing Provider  benzonatate (TESSALON) 100 MG capsule Take 1 capsule (100 mg total) by mouth every 8 (eight) hours. 11/09/15   Tharon Aquas, PA  cephALEXin (KEFLEX) 500 MG capsule Take 1 capsule (500 mg total) by mouth 4 (four) times daily. 11/15/15   Hayden Rasmussen, NP  FLAXSEED, LINSEED, PO Take 5 mLs by mouth 2 (two) times daily.    Historical Provider, MD  furosemide (LASIX) 40 MG tablet Take 1 tablet (40 mg total) by mouth daily. 11/09/15   Tharon Aquas, PA  gabapentin (NEURONTIN) 100 MG capsule Take 1 capsule (100 mg total) by mouth at bedtime. 07/20/14   Thao P Le, DO  glyBURIDE (DIABETA) 5 MG tablet TAKE ONE TABLET BY MOUTH TWICE DAILY WITH MEALS 08/31/15   Morrell Riddle, PA-C  hydrochlorothiazide (  HYDRODIURIL) 25 MG tablet Take 1/2 tab po daily 07/20/14   Thao P Le, DO  ibuprofen (ADVIL,MOTRIN) 800 MG tablet Take 1 tablet (800 mg total) by mouth 3 (three) times daily. Patient taking differently: Take 800 mg by mouth every 8 (eight) hours as needed for mild pain.  07/31/15   Melton KrebsSamantha Nicole Riley, PA-C  L-ARGININE PO Take 1 capsule by mouth 2 (two) times daily.    Historical Provider, MD  lisinopril (PRINIVIL,ZESTRIL) 10 MG tablet TAKE ONE TABLET BY MOUTH DAILY 08/31/15   Morrell RiddleSarah L Weber, PA-C  metFORMIN (GLUCOPHAGE) 1000 MG tablet TAKE ONE TABLET BY  MOUTH TWICE DAILY WITH MEALS 08/31/15   Morrell RiddleSarah L Weber, PA-C  methocarbamol (ROBAXIN) 500 MG tablet Take 1 tablet (500 mg total) by mouth 2 (two) times daily. 07/31/15   Melton KrebsSamantha Nicole Riley, PA-C  multivitamin (ONE-A-DAY MEN'S) TABS tablet Take 1 tablet by mouth daily.    Historical Provider, MD  omeprazole (PRILOSEC) 40 MG capsule Take 1 capsule (40 mg total) by mouth 2 (two) times daily. 08/08/14   Sherren MochaEva N Shaw, MD  oseltamivir (TAMIFLU) 75 MG capsule Take 1 capsule (75 mg total) by mouth every 12 (twelve) hours. 08/21/15   Teressa LowerVrinda Pickering, NP  OVER THE COUNTER MEDICATION Tribulus: Take 1 capsule by mouth nightly    Historical Provider, MD  sucralfate (CARAFATE) 1 G tablet Take 1 tablet (1 g total) by mouth 4 (four) times daily -  with meals and at bedtime. 08/08/14   Sherren MochaEva N Shaw, MD   Meds Ordered and Administered this Visit   Medications  Tdap (BOOSTRIX) injection 0.5 mL (0.5 mLs Intramuscular Given 11/15/15 1524)    BP 140/80 mmHg  Pulse 101  Temp(Src) 98.6 F (37 C) (Oral)  Resp 16  SpO2 99% No data found.   Physical Exam  Constitutional: He is oriented to person, place, and time. He appears well-developed and well-nourished. No distress.  Eyes: EOM are normal.  Neck: Normal range of motion. Neck supple.  Cardiovascular: Normal rate.   Pulmonary/Chest: Effort normal. No respiratory distress.  Musculoskeletal: He exhibits no edema.  Pre-existing edema to the right lower extremity below the knee. No cyanosis or cellulitis. There is a laceration to the base of the second toe. Currently no drainage or bleeding. No surrounding erythema or swelling. Is able to "wiggle" that toe.  Neurological: He is alert and oriented to person, place, and time. He exhibits normal muscle tone.  Skin: Skin is warm and dry.  Psychiatric: He has a normal mood and affect.  Nursing note and vitals reviewed.   ED Course  Procedures (including critical care time)  Labs Review Labs Reviewed - No data to  display  Imaging Review No results found.   Visual Acuity Review  Right Eye Distance:   Left Eye Distance:   Bilateral Distance:    Right Eye Near:   Left Eye Near:    Bilateral Near:         MDM   1. Toe laceration, initial encounter   2. Edema of right lower extremity   3. Diabetic polyneuropathy associated with type 2 diabetes mellitus (HCC)    (Edema preceded the toe injury) Soak the right foot and mildly warm diluted Betadine and water. Apply bacitracin ointment and dressing. Replace the cam walker. Administer T dap and follow-up with the podiatrist as soon as possible. Watch for any signs of infection. Will treat prophylactically with antibiotics due to the patient's diabetes, new wound and less than  optimal peripheral circulation. Meds ordered this encounter  Medications  . Tdap (BOOSTRIX) injection 0.5 mL    Sig:   . cephALEXin (KEFLEX) 500 MG capsule    Sig: Take 1 capsule (500 mg total) by mouth 4 (four) times daily.    Dispense:  28 capsule    Refill:  0    Order Specific Question:  Supervising Provider    Answer:  Linna Hoff [[id number]]       Hayden Rasmussen, NP 11/15/15 1537

## 2015-11-15 NOTE — Discharge Instructions (Signed)
Nonsutured Laceration Care Tdap administered A laceration is a cut that goes through all layers of the skin and extends into the tissue that is right under the skin. This type of cut is usually stitched up (sutured) or closed with tape (adhesive strips) or skin glue shortly after the injury happens. However, if the wound is dirty or if several hours pass before medical treatment is provided, it is likely that germs (bacteria) will enter the wound. Closing a laceration after bacteria have entered it increases the risk of infection. In these cases, your health care provider may leave the laceration open (nonsutured) and cover it with a bandage. This type of treatment helps prevent infection and allows the wound to heal from the deepest layer of tissue damage up to the surface. An open fracture is a type of injury that may involve nonsutured lacerations. An open fracture is a break in a bone that happens along with one or more lacerations through the skin that is near the fracture site. HOW TO CARE FOR YOUR NONSUTURED LACERATION  Take or apply over-the-counter and prescription medicines only as told by your health care provider.  If you were prescribed an antibiotic medicine, take or apply it as told by your health care provider. Do not stop using the antibiotic even if your condition improves.  Clean the wound one time each day or as told by your health care provider.  Wash the wound with mild soap and water.  Rinse the wound with water to remove all soap.  Pat your wound dry with a clean towel. Do not rub the wound.  Do not inject anything into the wound unless your health care provider told you to.  Change any bandages (dressings) as told by your health care provider. This includes changing the dressing if it gets wet, dirty, or starts to smell bad.  Keep the dressing dry until your health care provider says it can be removed. Do not take baths, swim, or do anything that puts your wound  underwater until your health care provider approves.  Raise (elevate) the injured area above the level of your heart while you are sitting or lying down, if possible.  Do not scratch or pick at the wound.  Check your wound every day for signs of infection. Watch for:  Redness, swelling, or pain.  Fluid, blood, or pus.  Keep all follow-up visits as told by your health care provider. This is important. SEEK MEDICAL CARE IF:  You received a tetanus and shot and you have swelling, severe pain, redness, or bleeding at the injection site.   You have a fever.  Your pain is not controlled with medicine.  You have increased redness, swelling, or pain at the site of your wound.  You have fluid, blood, or pus coming from your wound.  You notice a bad smell coming from your wound or your dressing.  You notice something coming out of the wound, such as wood or glass.  You notice a change in the color of your skin near your wound.  You develop a new rash.  You need to change the dressing frequently due to fluid, blood, or pus draining from the wound.  You develop numbness around your wound. SEEK IMMEDIATE MEDICAL CARE IF:  Your pain suddenly increases and is severe.  You develop severe swelling around the wound.  The wound is on your hand or foot and you cannot properly move a finger or toe.  The wound is on your  hand or foot and you notice that your fingers or toes look pale or bluish.  You have a red streak going away from your wound.   This information is not intended to replace advice given to you by your health care provider. Make sure you discuss any questions you have with your health care provider.   Document Released: 05/07/2006 Document Revised: 10/24/2014 Document Reviewed: 06/05/2014 Elsevier Interactive Patient Education [employer]

## 2015-11-21 ENCOUNTER — Ambulatory Visit: Payer: 59 | Admitting: Podiatry

## 2015-11-22 ENCOUNTER — Ambulatory Visit (INDEPENDENT_AMBULATORY_CARE_PROVIDER_SITE_OTHER): Payer: 59 | Admitting: Podiatry

## 2015-11-22 ENCOUNTER — Encounter: Payer: Self-pay | Admitting: Podiatry

## 2015-11-22 VITALS — BP 112/72 | HR 103 | Resp 16

## 2015-11-22 DIAGNOSIS — M1460 Charcot's joint, unspecified site: Secondary | ICD-10-CM

## 2015-11-22 DIAGNOSIS — S91119A Laceration without foreign body of unspecified toe without damage to nail, initial encounter: Secondary | ICD-10-CM | POA: Diagnosis not present

## 2015-11-22 MED ORDER — CEPHALEXIN 500 MG PO CAPS: 500.0000 mg | ORAL_CAPSULE | Freq: Four times a day (QID) | ORAL | Status: DC

## 2015-11-22 MED ORDER — MUPIROCIN 2 % EX OINT: TOPICAL_OINTMENT | CUTANEOUS | Status: DC

## 2015-11-22 NOTE — Progress Notes (Signed)
Randall Morton presents today as a brittle diabetic with a chief complaint of a laceration plantar aspect of the second digit right foot that occurred over a week ago as Randall Morton was removing his sock on the floor Randall Morton was cut with an ear ring. Randall Morton was scheduled to see us the following day however Randall Morton had to stop by his primary provider for a tetanus shot. That caused him to run late and Randall Morton canceled his appointment for our visit. Randall Morton presents today after having been placed on antibiotics. Randall Morton states that his blood sugar is unchanged.  Objective: Vital signs are stable with alert and oriented 3 pulses are palpable right foot. Much decrease in edema from Charcot event. Second digit the right foot demonstrates a laceration at the DIPJ transversely tendon is not visible however this does probe very deep toward the tendon. There is no purulence and no malodor the toe itself appears to be relatively normal in size.  Assessment laceration second digit right foot diabetes mellitus.  Plan: I trimmed the margins of the wound today and redressed the toe. I recommended that Randall Morton soak the toe at least every other day absence also warm water and cleaning it thoroughly. Also recommended us light dressing with Bactroban ointment. I also refilled his Flex. I will follow-up with him in 1 week for reevaluation.

## 2015-11-23 ENCOUNTER — Telehealth: Payer: Self-pay | Admitting: *Deleted

## 2015-11-23 NOTE — Telephone Encounter (Signed)
Pt states due to the issue with his cut on his toe does he need to follow up with his primary doctor earlier than 02/05/2016.  Left message informing pt to follow up with the PCP earlier if problems, or go to the ER if worsened.  I reviewed pt's last visit and called pt again left a message telling him if he had not gotten the tetanus shot he did need to get that as soon as possible.

## 2015-11-29 ENCOUNTER — Ambulatory Visit (INDEPENDENT_AMBULATORY_CARE_PROVIDER_SITE_OTHER): Payer: 59

## 2015-11-29 ENCOUNTER — Ambulatory Visit (INDEPENDENT_AMBULATORY_CARE_PROVIDER_SITE_OTHER): Payer: 59 | Admitting: Podiatry

## 2015-11-29 ENCOUNTER — Encounter: Payer: Self-pay | Admitting: Podiatry

## 2015-11-29 VITALS — BP 116/78 | HR 100 | Resp 12

## 2015-11-29 DIAGNOSIS — M1460 Charcot's joint, unspecified site: Secondary | ICD-10-CM

## 2015-11-29 DIAGNOSIS — M79671 Pain in right foot: Secondary | ICD-10-CM | POA: Diagnosis not present

## 2015-11-29 DIAGNOSIS — E1142 Type 2 diabetes mellitus with diabetic polyneuropathy: Secondary | ICD-10-CM

## 2015-11-29 NOTE — Progress Notes (Signed)
He presents today for follow-up of his Charcot arthropathy right foot. As well as the laceration beneath the second toe of the right foot. 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 He questions whether or not he can go back to work.  Objective: Vital signs are stable he is alert and oriented 3 much decrease in edema to the right lower extremity and the laceration has almost completely healed. Radiographs of the right foot does demonstrate a nice consolidation of Lisfranc's arthropathy associated with Charcot arthropathy. At this point I feel that the foot is stable enough for him to ambulate however I do recommend we have a pair of diabetic orthotics made.  Assessment: Charcot arthropathy with flattening of the foot right. Diabetes mellitus with diabetic peripheral neuropathy. Well-healed laceration second digit right foot.  Plan: He was scanned today for set of orthotics and we will allow him to go back to work.

## 2015-11-30 ENCOUNTER — Encounter: Payer: Self-pay | Admitting: Podiatry

## 2015-12-07 ENCOUNTER — Telehealth: Payer: Self-pay | Admitting: *Deleted

## 2015-12-07 DIAGNOSIS — M779 Enthesopathy, unspecified: Secondary | ICD-10-CM

## 2015-12-07 NOTE — Telephone Encounter (Addendum)
Julianne RiceStephanie Wilson states Dr. Al CorpusHyatt had discuss Lasix for pt's swelling and pt needs a refill if Dr. Al CorpusHyatt wants him to continue.  I informed pt that Dr. Al CorpusHyatt had not prescribed the Lasix, it had been prescribed by Homero FellersFrank C. Luisa HartPatrick, [place]. I told pt he should contact his PCP or the PA that prescribed for refills and follow up. Pt states understanding. 01/24/2016-Left message Esaw GrandchildKelly Griffith - [employer] informing her I was calling to get pre-cert of MRI Right foot without contrast, left [phone], pt's name, DOB, policy #. 01/28/2016-I spoke with Lisbeth Renshawigna - Cheryl, she states pt has been terminated from the insurance eligibility as of 12/21/2015. Elnita MaxwellCheryl states pt needs to contact Arline AspCindy in Eligibility to discuss his insurance coverage. Left message informing pt of the insurance termination and the contact name and phone for Donaldsonindy at [employer] for more information. 02/05/2016-Pt called for MRI results.  Dr. Al CorpusHyatt states the results show pt has plantar fasciitis, posterior tibial tendonitis no tear, send pt to PT.  I informed pt and he states he will do PT on Saturdays due to his work schedule 7am -3pm, then again at 5pm to 10pm. I called [facility], [facility], [facility], [facility], and [facility] and [facility] and they don't do Saturday or weekend PT, nor did they know of anyone that did. I googled PT on Saturdays and the 3 three listed had in the address they don't do Saturday PT. 02/06/2016-Left message informing pt I was leaving the message on his voicemail because it was urgent.  I told pt that Dr. Al CorpusHyatt stated he needed PT or risk rupturing his posterior tibial tendon.  I instructed pt to discuss with his supervisor as to a way to get time to take PT and call me again. Faxed PT orders.

## 2015-12-11 ENCOUNTER — Ambulatory Visit: Payer: 59 | Admitting: Podiatry

## 2015-12-13 ENCOUNTER — Emergency Department (HOSPITAL_COMMUNITY): Payer: 59

## 2015-12-13 ENCOUNTER — Encounter: Payer: Self-pay | Admitting: Physician Assistant

## 2015-12-13 ENCOUNTER — Encounter (HOSPITAL_COMMUNITY): Payer: Self-pay | Admitting: Emergency Medicine

## 2015-12-13 ENCOUNTER — Observation Stay (HOSPITAL_COMMUNITY)
Admission: EM | Admit: 2015-12-13 | Discharge: 2015-12-17 | Disposition: A | Discharge status: Home / Self Care | Payer: 59 | Attending: Family Medicine | Admitting: Family Medicine

## 2015-12-13 ENCOUNTER — Ambulatory Visit (INDEPENDENT_AMBULATORY_CARE_PROVIDER_SITE_OTHER): Payer: 59 | Admitting: Physician Assistant

## 2015-12-13 ENCOUNTER — Other Ambulatory Visit: Payer: Self-pay

## 2015-12-13 VITALS — BP 132/84 | HR 101 | Temp 98.3°F | Resp 18 | Ht 73.62 in | Wt 309.0 lb

## 2015-12-13 DIAGNOSIS — E11610 Type 2 diabetes mellitus with diabetic neuropathic arthropathy: Secondary | ICD-10-CM | POA: Diagnosis not present

## 2015-12-13 DIAGNOSIS — E66813 Obesity, class 3: Secondary | ICD-10-CM | POA: Diagnosis present

## 2015-12-13 DIAGNOSIS — Z789 Other specified health status: Secondary | ICD-10-CM

## 2015-12-13 DIAGNOSIS — R079 Chest pain, unspecified: Secondary | ICD-10-CM | POA: Diagnosis present

## 2015-12-13 DIAGNOSIS — E1169 Type 2 diabetes mellitus with other specified complication: Secondary | ICD-10-CM

## 2015-12-13 DIAGNOSIS — E1142 Type 2 diabetes mellitus with diabetic polyneuropathy: Secondary | ICD-10-CM | POA: Insufficient documentation

## 2015-12-13 DIAGNOSIS — E785 Hyperlipidemia, unspecified: Secondary | ICD-10-CM | POA: Insufficient documentation

## 2015-12-13 DIAGNOSIS — E8881 Metabolic syndrome: Secondary | ICD-10-CM | POA: Insufficient documentation

## 2015-12-13 DIAGNOSIS — E11319 Type 2 diabetes mellitus with unspecified diabetic retinopathy without macular edema: Secondary | ICD-10-CM | POA: Diagnosis not present

## 2015-12-13 DIAGNOSIS — Z833 Family history of diabetes mellitus: Secondary | ICD-10-CM | POA: Diagnosis not present

## 2015-12-13 DIAGNOSIS — E119 Type 2 diabetes mellitus without complications: Secondary | ICD-10-CM

## 2015-12-13 DIAGNOSIS — M14679 Charcot's joint, unspecified ankle and foot: Secondary | ICD-10-CM | POA: Insufficient documentation

## 2015-12-13 DIAGNOSIS — E786 Lipoprotein deficiency: Secondary | ICD-10-CM | POA: Diagnosis present

## 2015-12-13 DIAGNOSIS — Z8249 Family history of ischemic heart disease and other diseases of the circulatory system: Secondary | ICD-10-CM | POA: Diagnosis not present

## 2015-12-13 DIAGNOSIS — R0789 Other chest pain: Principal | ICD-10-CM | POA: Insufficient documentation

## 2015-12-13 DIAGNOSIS — E1340 Other specified diabetes mellitus with diabetic neuropathy, unspecified: Secondary | ICD-10-CM | POA: Diagnosis present

## 2015-12-13 DIAGNOSIS — E669 Obesity, unspecified: Secondary | ICD-10-CM

## 2015-12-13 DIAGNOSIS — R0602 Shortness of breath: Secondary | ICD-10-CM

## 2015-12-13 DIAGNOSIS — Z794 Long term (current) use of insulin: Secondary | ICD-10-CM | POA: Diagnosis not present

## 2015-12-13 DIAGNOSIS — I10 Essential (primary) hypertension: Secondary | ICD-10-CM | POA: Diagnosis present

## 2015-12-13 DIAGNOSIS — Z6841 Body Mass Index (BMI) 40.0 and over, adult: Secondary | ICD-10-CM | POA: Diagnosis not present

## 2015-12-13 DIAGNOSIS — R9439 Abnormal result of other cardiovascular function study: Secondary | ICD-10-CM | POA: Diagnosis not present

## 2015-12-13 HISTORY — DX: Type 2 diabetes mellitus with diabetic neuropathic arthropathy: E11.610

## 2015-12-13 HISTORY — DX: Type 2 diabetes mellitus with diabetic polyneuropathy: E11.42

## 2015-12-13 LAB — TROPONIN I: Troponin I: <0.03 ng/mL (ref <0.031)

## 2015-12-13 LAB — COMPREHENSIVE METABOLIC PANEL
ALBUMIN: 3.6 g/dL (ref 3.5–5.0)
ALT: 22 U/L (ref 17–63)
AST: 23 U/L (ref 15–41)
Alkaline Phosphatase: 71 U/L (ref 38–126)
Anion gap: 6 (ref 5–15)
BILIRUBIN TOTAL: 0.5 mg/dL (ref 0.3–1.2)
BUN: 10 mg/dL (ref 6–20)
CHLORIDE: 108 mmol/L (ref 101–111)
CO2: 24 mmol/L (ref 22–32)
Calcium: 9 mg/dL (ref 8.9–10.3)
Creatinine, Ser: 0.94 mg/dL (ref 0.61–1.24)
GFR calc Af Amer: >60 mL/min (ref >60)
GFR calc non Af Amer: >60 mL/min (ref >60)
GLUCOSE: 143 mg/dL — AB (ref 65–99)
POTASSIUM: 4 mmol/L (ref 3.5–5.1)
SODIUM: 138 mmol/L (ref 135–145)
TOTAL PROTEIN: 7.5 g/dL (ref 6.5–8.1)

## 2015-12-13 LAB — CBC WITH DIFFERENTIAL/PLATELET
Basophils Absolute: 0 10*3/uL (ref 0.0–0.1)
Basophils Relative: 0 %
EOS ABS: 0.2 10*3/uL (ref 0.0–0.7)
EOS PCT: 3 %
HCT: 41.7 % (ref 39.0–52.0)
HEMOGLOBIN: 14.5 g/dL (ref 13.0–17.0)
LYMPHS ABS: 3.2 10*3/uL (ref 0.7–4.0)
LYMPHS PCT: 44 %
MCH: 29.7 pg (ref 26.0–34.0)
MCHC: 34.8 g/dL (ref 30.0–36.0)
MCV: 85.5 fL (ref 78.0–100.0)
MONOS PCT: 6 %
Monocytes Absolute: 0.5 10*3/uL (ref 0.1–1.0)
Neutro Abs: 3.4 10*3/uL (ref 1.7–7.7)
Neutrophils Relative %: 47 %
PLATELETS: 244 10*3/uL (ref 150–400)
RBC: 4.88 MIL/uL (ref 4.22–5.81)
RDW: 12.6 % (ref 11.5–15.5)
WBC: 7.3 10*3/uL (ref 4.0–10.5)

## 2015-12-13 LAB — GLUCOSE, CAPILLARY
GLUCOSE-CAPILLARY: 77 mg/dL (ref 65–99)
GLUCOSE-CAPILLARY: 190 mg/dL — AB (ref 65–99)

## 2015-12-13 LAB — I-STAT TROPONIN, ED: Troponin i, poc: 0 ng/mL (ref 0.00–0.08)

## 2015-12-13 MED ORDER — ONDANSETRON HCL 4 MG/2ML IJ SOLN: 4.0000 mg | Freq: Four times a day (QID) | INTRAMUSCULAR | Status: DC | PRN

## 2015-12-13 MED ORDER — ALPRAZOLAM 0.25 MG PO TABS: 0.2500 mg | ORAL_TABLET | Freq: Two times a day (BID) | ORAL | Status: DC | PRN

## 2015-12-13 MED ORDER — GI COCKTAIL ~~LOC~~: 30.0000 mL | Freq: Four times a day (QID) | ORAL | Status: DC | PRN

## 2015-12-13 MED ORDER — ACETAMINOPHEN 325 MG PO TABS: 650.0000 mg | ORAL_TABLET | ORAL | Status: DC | PRN

## 2015-12-13 MED ORDER — SODIUM CHLORIDE 0.9 % IV SOLN
INTRAVENOUS | Status: AC
  Administered 2015-12-13: 17:00:00 via INTRAVENOUS

## 2015-12-13 MED ORDER — INSULIN ASPART 100 UNIT/ML ~~LOC~~ SOLN
0.0000 [IU] | Freq: Three times a day (TID) | SUBCUTANEOUS | Status: DC
  Administered 2015-12-15 – 2015-12-16 (×5): 3 [IU] via SUBCUTANEOUS
  Administered 2015-12-17: 7 [IU] via SUBCUTANEOUS

## 2015-12-13 MED ORDER — ASPIRIN 81 MG PO CHEW
324.0000 mg | CHEWABLE_TABLET | Freq: Every day | ORAL | Status: DC
  Administered 2015-12-14 – 2015-12-16 (×3): 324 mg via ORAL
  Filled 2015-12-13 (×4): qty 4

## 2015-12-13 MED ORDER — NITROGLYCERIN 0.4 MG SL SUBL: 0.4000 mg | SUBLINGUAL_TABLET | SUBLINGUAL | Status: DC | PRN

## 2015-12-13 MED ORDER — ZOLPIDEM TARTRATE 5 MG PO TABS: 5.0000 mg | ORAL_TABLET | Freq: Every evening | ORAL | Status: DC | PRN

## 2015-12-13 MED ORDER — HEPARIN (PORCINE) IN NACL 100-0.45 UNIT/ML-% IJ SOLN
1400.0000 [IU]/h | INTRAMUSCULAR | Status: DC
  Administered 2015-12-13: 1400 [IU]/h via INTRAVENOUS
  Filled 2015-12-13: qty 250

## 2015-12-13 MED ORDER — ASPIRIN 81 MG PO CHEW
324.0000 mg | CHEWABLE_TABLET | Freq: Once | ORAL | Status: DC
  Administered 2015-12-13: 324 mg via ORAL

## 2015-12-13 MED ORDER — HEPARIN SODIUM (PORCINE) 5000 UNIT/ML IJ SOLN
4000.0000 [IU] | Freq: Once | INTRAMUSCULAR | Status: AC
  Administered 2015-12-13: 4000 [IU] via INTRAVENOUS

## 2015-12-13 MED ORDER — LISINOPRIL 10 MG PO TABS
10.0000 mg | ORAL_TABLET | Freq: Every day | ORAL | Status: DC
  Administered 2015-12-14 – 2015-12-15 (×2): 10 mg via ORAL
  Filled 2015-12-13 (×2): qty 1

## 2015-12-13 MED ORDER — ASPIRIN 81 MG PO CHEW: 324.0000 mg | CHEWABLE_TABLET | Freq: Once | ORAL | Status: DC

## 2015-12-13 MED ORDER — MORPHINE SULFATE (PF) 2 MG/ML IV SOLN: 2.0000 mg | INTRAVENOUS | Status: DC | PRN

## 2015-12-13 MED ORDER — HEPARIN SODIUM (PORCINE) 5000 UNIT/ML IJ SOLN
5000.0000 [IU] | Freq: Three times a day (TID) | INTRAMUSCULAR | Status: DC
  Administered 2015-12-13 – 2015-12-16 (×7): 5000 [IU] via SUBCUTANEOUS
  Filled 2015-12-13 (×8): qty 1

## 2015-12-13 MED ORDER — INSULIN ASPART 100 UNIT/ML ~~LOC~~ SOLN: 0.0000 [IU] | Freq: Every day | SUBCUTANEOUS | Status: DC

## 2015-12-13 NOTE — Progress Notes (Signed)
ANTICOAGULATION CONSULT NOTE - Initial Consult  Pharmacy Consult for heparin Indication: chest pain/ACS  No Known Allergies  Patient Measurements: Height: 6\' 1"  (185.4 cm) Weight: 300 lb (136.079 kg) IBW/kg (Calculated) : 79.9 Heparin Dosing Weight: 110  Vital Signs: Temp: 98 F (36.7 C) (06/22 1411) Temp Source: Oral (06/22 1411) BP: 122/97 mmHg (06/22 1445) Pulse Rate: 88 (06/22 1445)  Labs:  Recent Labs  12/13/15 1218  HGB 14.5  HCT 41.7  PLT 244  CREATININE 0.94    Estimated Creatinine Clearance: 140.7 mL/min (by C-G formula based on Cr of 0.94).   Medical History: Past Medical History  Diagnosis Date  . Diabetes mellitus without complication (HCC)   . Hypertension   . Diabetic retinopathy (HCC)     last checked on 04/2014  . Osteomyelitis (HCC)     left foot ulcer--> osteo    Medications:  Scheduled:  . heparin  4,000 Units Intravenous Once  . insulin aspart  0-20 Units Subcutaneous TID WC  . insulin aspart  0-5 Units Subcutaneous QHS    Assessment: 45 yo with CP. Pharmacy consulted to dose heparin for r/o ACS. Not on anticoagulation PTA. CBC wnl  Goal of Therapy:  Heparin level 0.3-0.7 units/ml Monitor platelets by anticoagulation protocol: Yes   Plan:  Give 4000 units bolus x 1 Start heparin infusion at 1400 units/hr Check anti-Xa level in 6 hours and daily while on heparin Continue to monitor H&H and platelets  Sherron MondayAubrey N. Eretria Manternach, PharmD Clinical Pharmacy Resident Pager: [phone] 12/13/2015 3:51 PM

## 2015-12-13 NOTE — ED Provider Notes (Signed)
CSN: [id number]     Arrival date & time 12/13/15  1151 History  By signing my name below, I, Tanda RockersMargaux Venter, attest that this documentation has been prepared under the direction and in the presence of [employer] MaterialsJessica Abbye Lao, PA-C.  Electronically Signed: Tanda RockersMargaux Venter, ED Scribe. 12/13/2015. 1:06 PM.   Chief Complaint  Patient presents with  . Chest Pain   The history is provided by the patient. No language interpreter was used.    HPI Comments: Randall Morton is a 45 y.o. male brought in by ambulance, with PMHx DM and HTN who presents to the Emergency Department complaining of sudden onset, intermittent, tight, chest pain that began this morning around 7:30 AM. Pt also complains of diaphoresis, palpitations, and nausea with the episodes of chest pain. He states that he had a bowel movement to attempt to relieve the pain without relief. His bowel movement was normal. Pt has never had symptoms like this in the past. He was seen at UC this morning and had a 12 lead EKG done with no acute findings. He was sent here for further evaluation and to have blood work done. Pt is currently chest pain free. Denies vomiting, constipation, diarrhea, hematochezia, back pain, dizziness, visual changes, syncope, falling, weakness, numbness, tingling, fever, sore throat, shortness of breath, or any other associated symptoms.   Past Medical History  Diagnosis Date  . Diabetes mellitus without complication (HCC)   . Hypertension   . Diabetic retinopathy (HCC)     last checked on 04/2014  . Osteomyelitis (HCC)     left foot ulcer--> osteo   History reviewed. No pertinent past surgical history. Family History  Problem Relation Age of Onset  . Asthma Mother   . Diabetes Father   . Heart disease Father   . Hyperlipidemia Father   . Hypertension Father   . Stroke Father   . Prostate cancer Paternal Grandfather    Social History  Substance Use Topics  . Smoking status: Never Smoker   . Smokeless tobacco: None  .  Alcohol Use: 0.0 oz/week    0 Standard drinks or equivalent per week    Review of Systems  Constitutional: Negative for fever.  HENT: Negative for sore throat.   Eyes: Negative for visual disturbance.  Respiratory: Negative for shortness of breath.   Cardiovascular: Positive for chest pain and palpitations.  Gastrointestinal: Positive for nausea. Negative for vomiting, abdominal pain, diarrhea, constipation and blood in stool.  Musculoskeletal: Negative for back pain.  Neurological: Negative for dizziness, syncope, weakness and numbness.   Allergies  Review of patient's allergies indicates no known allergies.  Home Medications   Prior to Admission medications   Medication Sig Start Date End Date Taking? Authorizing Provider  lisinopril (PRINIVIL,ZESTRIL) 10 MG tablet TAKE ONE TABLET BY MOUTH DAILY 08/31/15  Yes Morrell RiddleSarah L Weber, PA-C   BP 130/92 mmHg  Pulse 88  Temp(Src) 98.4 F (36.9 C) (Oral)  Resp 19  Ht 6\' 1"  (1.854 m)  Wt 300 lb (136.079 kg)  BMI 39.59 kg/m2  SpO2 99%   Physical Exam  Constitutional: He is oriented to person, place, and time. He appears well-developed and well-nourished. No distress.  HENT:  Head: Normocephalic and atraumatic.  Eyes: Conjunctivae and EOM are normal.  Neck: Neck supple. No tracheal deviation present.  Cardiovascular: Normal rate, regular rhythm and normal heart sounds.   Pulses:      Radial pulses are 2+ on the right side, and 2+ on the left side.  Posterior tibial pulses are 2+ on the right side, and 2+ on the left side.  Pulmonary/Chest: Effort normal and breath sounds normal. No respiratory distress. He has no wheezes. He has no rales.  Abdominal: Soft. There is no tenderness.  Musculoskeletal: Normal range of motion.  Neurological: He is alert and oriented to person, place, and time.  Skin: Skin is warm and dry.  Psychiatric: He has a normal mood and affect. His behavior is normal.  Nursing note and vitals reviewed.   ED  Course  Procedures (including critical care time)  DIAGNOSTIC STUDIES: Oxygen Saturation is 99% on RA, normal by my interpretation.    COORDINATION OF CARE: 1:05 PM-Discussed treatment plan which includes CXR  with pt at bedside and pt agreed to plan.   Labs Review Labs Reviewed  COMPREHENSIVE METABOLIC PANEL - Abnormal; Notable for the following:    Glucose, Bld 143 (*)    All other components within normal limits  CBC WITH DIFFERENTIAL/PLATELET  TROPONIN I  TROPONIN I  TROPONIN I  HEPARIN LEVEL (UNFRACTIONATED)  Rosezena SensorI-STAT TROPOININ, ED    Imaging Review Dg Chest Port 1 View  12/13/2015  CLINICAL DATA:  Chest pain for 1 day. EXAM: PORTABLE CHEST 1 VIEW COMPARISON:  None. FINDINGS: The heart size and mediastinal contours are within normal limits. Both lungs are clear. The visualized skeletal structures are unremarkable. IMPRESSION: No active disease. Electronically Signed   By: Norva PavlovElizabeth  Brown M.D.   On: 12/13/2015 13:23   I have personally reviewed and evaluated these images and lab results as part of my medical decision-making.   EKG Interpretation   Date/Time:  Thursday December 13 2015 11:53:34 EDT Ventricular Rate:  88 PR Interval:  194 QRS Duration: 70 QT Interval:  344 QTC Calculation: 416 R Axis:   9 Text Interpretation:  Normal sinus rhythm Normal ECG Confirmed by ZAMMIT   MD, JOSEPH [phone]) on 12/13/2015 2:40:35 PM      MDM   Final diagnoses:  Chest pain    Concern for cardiac etiology of Chest Pain. Hospitalist has been consulted and will see patient in the ED for likely admit. Pt does not meet criteria for CP protocol and a further evaluation is recommended. Pt has been re-evaluated prior to consult and VSS, NAD, heart RRR, pain 0/10, lungs CTAB. No acute abnormalities found on EKG and first round of cardiac enzymes negative. Patient has a very strong family history of MI as his father passed away from an MI at 5053. This case was discussed with Dr. Estell HarpinZammit who has  seen the patient and agrees with plan to admit.   I consulted the hospitalist team who will admit the patient for cardiac workup for chest pain. Thank you to the hospitalist team for your attention to and care of this patient.  I personally performed the services described in this documentation, which was scribed in my presence. The recorded information has been reviewed and is accurate.        Jerre SimonJessica L Iretha Kirley, PA 12/13/15 1613  Bethann BerkshireJoseph Zammit, MD 12/15/15 [id number]

## 2015-12-13 NOTE — Patient Instructions (Signed)
     IF you received an x-ray today, you will receive an invoice from [facility]. Please contact [facility] at [phone] with questions or concerns regarding your invoice.   IF you received labwork today, you will receive an invoice from [facility]. Please contact [facility] at [phone] with questions or concerns regarding your invoice.   Our billing staff will not be able to assist you with questions regarding bills from these companies.  You will be contacted with the lab results as soon as they are available. The fastest way to get your results is to activate your My Chart account. Instructions are located on the last page of this paperwork. If you have not heard from us regarding the results in 2 weeks, please contact this office.      

## 2015-12-13 NOTE — Progress Notes (Signed)
Subjective:    Patient ID: Randall PrestoCarl S Morton, male    DOB: [birth year], 45 y.o.   MRN: [id number]  Chief Complaint  Patient presents with  . Shortness of Breath  . Chest Pain    Left side. Since this morning.   . Excessive Sweating  . Medication Refill    metformin, lisinopril, diabeta    HPI  Patient presents today with complaints of chest pain, SOB and diaphoreis.  While at work early he became diaphoretic, sweating through his shirt,  felt a substernal and left sided pressure,and his "heart start fluttering", and he became nauseated.  The pain stopped and he had 2 additional episodes, followed by 1 episode of SOB with climbing stairs.    At this time he is experiencing a little pain, "feels like an overload of anxiety, tightness and pounding, and elevated heart rate."  He states "Now my stomach is churning, and I feel like an episode of diarrhea is coming on."  Denies sharp pain, radiation to jaw, arm or back; vomiting, headache, current SOB, dyspnea, heartburn, indigestion, chest wall pain, trauma, new medications, past history of chest pain/MI.  Admits to increased stress and fatigue the last week, left extremity swelling, new workout regimen with cardio and weight training, recent diagnosis of Charcot's foot.  Personal history of diabetes and hypertension, non-compliant with medications due to "running out."   Family history significant for Father having MI at age 47-51, with hypertension and diabetes.   Review of Systems  Constitutional: Positive for diaphoresis and activity change.  Respiratory: Positive for chest tightness and shortness of breath. Negative for cough and choking.   Cardiovascular: Positive for chest pain, palpitations and leg swelling.  Gastrointestinal: Positive for nausea. Negative for vomiting and abdominal pain.  Genitourinary: Negative for difficulty urinating.  Skin: Negative for color change and pallor.  Neurological: Positive for weakness. Negative for  dizziness, numbness and headaches.   Patient Active Problem List   Diagnosis Date Noted  . Toe osteomyelitis, left (HCC) 07/11/2014  . Diabetes mellitus type II, uncontrolled (HCC) 08/26/2013  . Neuropathy due to secondary diabetes (HCC) 08/26/2013  . HTN (hypertension) 08/26/2013  . Obesity, Class III, BMI 40-49.9 (morbid obesity) (HCC) 09/17/2012  . DYSMETABOLIC SYNDROME X 09/08/2007  . HYPERTENSION 02/24/2007  . Type II or unspecified type diabetes mellitus without mention of complication, not stated as uncontrolled 11/06/2006   Current Outpatient Prescriptions on File Prior to Visit  Medication Sig Dispense Refill  . FLAXSEED, LINSEED, PO Take 5 mLs by mouth 2 (two) times daily.    [name]. glyBURIDE (DIABETA) 5 MG tablet TAKE ONE TABLET BY MOUTH TWICE DAILY WITH MEALS 60 tablet 0  . ibuprofen (ADVIL,MOTRIN) 800 MG tablet Take 1 tablet (800 mg total) by mouth 3 (three) times daily. (Patient taking differently: Take 800 mg by mouth every 8 (eight) hours as needed for mild pain. ) 21 tablet 0  . L-ARGININE PO Take 1 capsule by mouth 2 (two) times daily.    [name]. lisinopril (PRINIVIL,ZESTRIL) 10 MG tablet TAKE ONE TABLET BY MOUTH DAILY 30 tablet 0  . metFORMIN (GLUCOPHAGE) 1000 MG tablet TAKE ONE TABLET BY MOUTH TWICE DAILY WITH MEALS 60 tablet 0  . multivitamin (ONE-A-DAY MEN'S) TABS tablet Take 1 tablet by mouth daily.    . mupirocin ointment (BACTROBAN) 2 % Apply to wound BID 30 g 1  . omeprazole (PRILOSEC) 40 MG capsule Take 1 capsule (40 mg total) by mouth 2 (two) times daily. 60 capsule 1  .  OVER THE COUNTER MEDICATION Tribulus: Take 1 capsule by mouth nightly    . furosemide (LASIX) 40 MG tablet Take 1 tablet (40 mg total) by mouth daily. (Patient not taking: Reported on 12/13/2015) 20 tablet 0  . gabapentin (NEURONTIN) 100 MG capsule Take 1 capsule (100 mg total) by mouth at bedtime. (Patient not taking: Reported on 12/13/2015) 30 capsule 6  . hydrochlorothiazide (HYDRODIURIL) 25 MG tablet  Take 1/2 tab po daily (Patient not taking: Reported on 12/13/2015) 30 tablet 3   No current facility-administered medications on file prior to visit.    No Known Allergies     Objective:   Physical Exam  Constitutional: He is oriented to person, place, and time. He appears well-developed and well-nourished. No distress.  Morbidly obese  HENT:  Head: Normocephalic.  Eyes: Conjunctivae are normal. Pupils are equal, round, and reactive to light.  Neck: Normal range of motion. Neck supple. No thyromegaly present.  Cardiovascular: Regular rhythm, normal heart sounds and intact distal pulses.  Tachycardia present.  Exam reveals no gallop and no distant heart sounds.   No murmur heard. Pulses:      Radial pulses are 2+ on the right side, and 2+ on the left side.       Dorsalis pedis pulses are 2+ on the right side, and 2+ on the left side.       Posterior tibial pulses are 2+ on the right side, and 2+ on the left side.  3+ pitting edema of right lower extremity   Pulmonary/Chest: Effort normal and breath sounds normal. No accessory muscle usage. No respiratory distress. He exhibits no tenderness.  Abdominal: Soft. He exhibits no distension. There is no tenderness.  Neurological: He is alert and oriented to person, place, and time.  Skin: Skin is warm and dry. He is not diaphoretic. No pallor.  Psychiatric: He has a normal mood and affect. His speech is normal and behavior is normal. Thought content normal.      Assessment & Plan:  1. Chest pain, unspecified chest pain type - EKG 12-Lead 2. SOB (shortness of breath)  Clinical presentation, PMH and family history raise concerns for possible cardiac cause. Patient given 325 mg aspirin to chew in office.   EKG performed  Patient placed on cardiac monitor and EMS called for transport to ED at 11am.   ED charge nurse notified.   IV attempt unsuccessful prior to EMS arrival.  Etheleen SiaLindsay P. Silva Aamodt, PA-S

## 2015-12-13 NOTE — Progress Notes (Signed)
Patient ID: Scarlett PrestoCarl S Abed, male    DOB: [birth year], 45 y.o.   MRN: [id number]  PCP: Rockne Coonshao Phuong Le, DO (left practice 08/2015)  Subjective:   Chief Complaint  Patient presents with  . Shortness of Breath  . Chest Pain    Left side. Since this morning.   . Excessive Sweating  . Medication Refill    metformin, lisinopril, diabeta    HPI Presents for evaluation of chest pain, SOB and diaphoresis that occurred several times this morning at work. Exertional. Pain is pressure, tightness. Associated "fluttering" sensation in the chest.  The pain is better now, but still present. He thinks that it may be anxiety as he has been under a lot of stress lately. Has the sensation that he may need to defecate, like diarrhea.  No previous pain like this. Father had an MI at age 47.  He has DM and HTN, and is not compliant with all his medications, having run out. He requests refills of his medications    Review of Systems  Constitutional: Positive for diaphoresis. Negative for fever and chills.  Eyes: Negative for visual disturbance.  Respiratory: Positive for chest tightness and shortness of breath. Negative for cough, wheezing and stridor.   Cardiovascular: Positive for chest pain, palpitations and leg swelling.  Gastrointestinal: Positive for abdominal pain ("churning"). Negative for nausea, vomiting and diarrhea.  Genitourinary: Negative for dysuria, urgency and frequency.  Musculoskeletal: Negative for myalgias and arthralgias.  Skin: Negative.   Neurological: Negative for dizziness, weakness, light-headedness and headaches.       Patient Active Problem List   Diagnosis Date Noted  . Toe osteomyelitis, left (HCC) 07/11/2014  . Diabetes mellitus type II, uncontrolled (HCC) 08/26/2013  . Neuropathy due to secondary diabetes (HCC) 08/26/2013  . HTN (hypertension) 08/26/2013  . Obesity, Class III, BMI 40-49.9 (morbid obesity) (HCC) 09/17/2012  . DYSMETABOLIC SYNDROME X  09/08/2007  . HYPERTENSION 02/24/2007  . Type II or unspecified type diabetes mellitus without mention of complication, not stated as uncontrolled 11/06/2006     Prior to Admission medications   Medication Sig Start Date End Date Taking? Authorizing Provider  FLAXSEED, LINSEED, PO Take 5 mLs by mouth 2 (two) times daily.   Yes Historical Provider, MD  glyBURIDE (DIABETA) 5 MG tablet TAKE ONE TABLET BY MOUTH TWICE DAILY WITH MEALS 08/31/15  Yes Morrell RiddleSarah L Weber, PA-C  ibuprofen (ADVIL,MOTRIN) 800 MG tablet Take 1 tablet (800 mg total) by mouth 3 (three) times daily. Patient taking differently: Take 800 mg by mouth every 8 (eight) hours as needed for mild pain.  07/31/15  Yes Melton KrebsSamantha Nicole Riley, PA-C  L-ARGININE PO Take 1 capsule by mouth 2 (two) times daily.   Yes Historical Provider, MD  lisinopril (PRINIVIL,ZESTRIL) 10 MG tablet TAKE ONE TABLET BY MOUTH DAILY 08/31/15  Yes Morrell RiddleSarah L Weber, PA-C  metFORMIN (GLUCOPHAGE) 1000 MG tablet TAKE ONE TABLET BY MOUTH TWICE DAILY WITH MEALS 08/31/15  Yes Morrell RiddleSarah L Weber, PA-C  multivitamin (ONE-A-DAY MEN'S) TABS tablet Take 1 tablet by mouth daily.   Yes Historical Provider, MD  mupirocin ointment (BACTROBAN) 2 % Apply to wound BID 11/22/15  Yes Max T Hyatt, DPM  omeprazole (PRILOSEC) 40 MG capsule Take 1 capsule (40 mg total) by mouth 2 (two) times daily. 08/08/14  Yes Sherren MochaEva N Shaw, MD  OVER THE COUNTER MEDICATION Tribulus: Take 1 capsule by mouth nightly   Yes Historical Provider, MD  furosemide (LASIX) 40 MG tablet Take 1 tablet (40 mg  total) by mouth daily. Patient not taking: Reported on 12/13/2015 11/09/15   Tharon AquasFrank C Patrick, PA  gabapentin (NEURONTIN) 100 MG capsule Take 1 capsule (100 mg total) by mouth at bedtime. Patient not taking: Reported on 12/13/2015 07/20/14   Thao P Le, DO  hydrochlorothiazide (HYDRODIURIL) 25 MG tablet Take 1/2 tab po daily Patient not taking: Reported on 12/13/2015 07/20/14   Thao P Le, DO     No Known Allergies     Objective:    Physical Exam  Constitutional: He is oriented to person, place, and time. He appears well-developed and well-nourished. He is active and cooperative. No distress.  BP 132/84 mmHg  Pulse 101  Temp(Src) 98.3 F (36.8 C) (Oral)  Resp 18  Ht 6' 1.62" (1.87 m)  Wt 309 lb (140.161 kg)  BMI 40.08 kg/m2  SpO2 98%  HENT:  Head: Normocephalic and atraumatic.  Right Ear: Hearing normal.  Left Ear: Hearing normal.  Eyes: Conjunctivae are normal. No scleral icterus.  Neck: Normal range of motion. Neck supple. No thyromegaly present.  Cardiovascular: Normal rate, regular rhythm and normal heart sounds.   Pulses:      Radial pulses are 2+ on the right side, and 2+ on the left side.  Pulmonary/Chest: Effort normal and breath sounds normal. He exhibits no tenderness.  Abdominal: Soft. Bowel sounds are normal. He exhibits no distension. There is no tenderness.  Lymphadenopathy:       Head (right side): No tonsillar, no preauricular, no posterior auricular and no occipital adenopathy present.       Head (left side): No tonsillar, no preauricular, no posterior auricular and no occipital adenopathy present.    He has no cervical adenopathy.       Right: No supraclavicular adenopathy present.       Left: No supraclavicular adenopathy present.  Neurological: He is alert and oriented to person, place, and time. No sensory deficit.  Skin: Skin is warm, dry and intact. No rash noted. No cyanosis or erythema. Nails show no clubbing.  Psychiatric: He has a normal mood and affect. His speech is normal and behavior is normal.    EKG reveals reveals anterolateral ST elevation, possibly a normal variant. Reviewed with Dr. Katrinka BlazingSmith.       Assessment & Plan:   1. Chest pain, unspecified chest pain type 2. SOB (shortness of breath) Concern for cardiac etiology given comorbidities (DM, HTN, obesity) and family history of MI in his father at age 47. - EKG 12-Lead 324 mg ASA chewable. Monitor placed. EMS  contacted for transport at 11 am. ED Charge nurse notified. IV attempted x 2 without success. Care transferred to EMS.  Fernande Brashelle S. Kshawn Canal, PA-C Physician Assistant-Certified [facility]

## 2015-12-13 NOTE — ED Notes (Signed)
Ems states pt started c/o cp at work todaY. Pt is now pain free.

## 2015-12-13 NOTE — H&P (Signed)
History and Physical    Randall PrestoCarl S Mcphail XBJ:[id number] DOB: 11-Jul-1970 DOA: 12/13/2015  PCP:  Patient coming from: Dr. Alwyn RenHopper  Chief Complaint: Chest   HPI: Randall Morton is a 45 y.o. male with medical history significant of hypertension and Diabetes mellitus type II.  Pt reports he began having chest pain this am.  Pt reports multiple episodes lasting less than 10 minutes each.  Pt went to Urgent care.  Pt reports he was given 4 baby aspirin's and pain resolved.  Pt reports no current chest pain.  Pt denies any recent injury. No heavy lifting.  Pt denies any previous chest pain.  Pt denies any shortness of breath.  Pt denies fever or cough.  He denies any recent illness.  Pt reports his father had a heart attack. He states his father was told it was because of his diabetes. Pt's mother has had a cardiac cath but no history of heart attack.  Pt does not smoke    ED Course: Pt has been comfortable in ED,  No further chest pain.  First troponin is negative    Review of Systems: As per HPI otherwise 10 point review of systems negative.   Ambulatory Status: normal  Past Medical History  Diagnosis Date  . Diabetes mellitus without complication (HCC)   . Hypertension   . Diabetic retinopathy (HCC)     last checked on 04/2014  . Osteomyelitis (HCC)     left foot ulcer--> osteo    History reviewed. No pertinent past surgical history.  Social History   Social History  . Marital Status: Single    Spouse Name: N/A  . Number of Children: N/A  . Years of Education: N/A   Occupational History  . Not on file.   Social History Main Topics  . Smoking status: Never Smoker   . Smokeless tobacco: Not on file  . Alcohol Use: 0.0 oz/week    0 Standard drinks or equivalent per week  . Drug Use: No  . Sexual Activity: Not on file   Other Topics Concern  . Not on file   Social History Narrative    No Known Allergies  Family History  Problem Relation Age of Onset  . Asthma Mother   .  Diabetes Father   . Heart disease Father   . Hyperlipidemia Father   . Hypertension Father   . Stroke Father   . Prostate cancer Paternal Grandfather     Prior to Admission medications   Medication Sig Start Date End Date Taking? Authorizing Provider  lisinopril (PRINIVIL,ZESTRIL) 10 MG tablet TAKE ONE TABLET BY MOUTH DAILY 08/31/15  Yes Morrell RiddleSarah L Weber, PA-C    Physical Exam: Filed Vitals:   12/13/15 1411 12/13/15 1415 12/13/15 1430 12/13/15 1445  BP: 130/90 134/80 132/88 122/97  Pulse: 82 84 77 88  Temp: 98 F (36.7 C)     TempSrc: Oral     Resp: 15 16 17 12   Height:      Weight:      SpO2: 100% 100% 99% 100%     General:  Appears calm and comfortable Eyes:  PERRL, EOMI, normal lids, iris ENT:  grossly normal hearing, lips & tongue, mmm Neck:  no LAD, masses or thyromegaly Cardiovascular:  RRR, no m/r/g. No LE edema.  Respiratory:  CTA bilaterally, no w/r/r. Normal respiratory effort. Abdomen:  soft, ntnd, NABS Skin:  no rash or induration seen on limited exam Musculoskeletal:  grossly normal tone BUE/BLE, good ROM, no  bony abnormality Psychiatric:  grossly normal mood and affect, speech fluent and appropriate, AOx3 Neurologic:  CN 2-12 grossly intact, moves all extremities in coordinated fashion, sensation intact  Labs on Admission: I have personally reviewed following labs and imaging studies  CBC:  Recent Labs Lab 12/13/15 1218  WBC 7.3  NEUTROABS 3.4  HGB 14.5  HCT 41.7  MCV 85.5  PLT 244   Basic Metabolic Panel:  Recent Labs Lab 12/13/15 1218  NA 138  K 4.0  CL 108  CO2 24  GLUCOSE 143*  BUN 10  CREATININE 0.94  CALCIUM 9.0   GFR: Estimated Creatinine Clearance: 140.7 mL/min (by C-G formula based on Cr of 0.94). Liver Function Tests:  Recent Labs Lab 12/13/15 1218  AST 23  ALT 22  ALKPHOS 71  BILITOT 0.5  PROT 7.5  ALBUMIN 3.6   No results for input(s): LIPASE, AMYLASE in the last 168 hours. No results for input(s): AMMONIA in the  last 168 hours. Coagulation Profile: No results for input(s): INR, PROTIME in the last 168 hours. Cardiac Enzymes: No results for input(s): CKTOTAL, CKMB, CKMBINDEX, TROPONINI in the last 168 hours. BNP (last 3 results) No results for input(s): PROBNP in the last 8760 hours. HbA1C: No results for input(s): HGBA1C in the last 72 hours. CBG: No results for input(s): GLUCAP in the last 168 hours. Lipid Profile: No results for input(s): CHOL, HDL, LDLCALC, TRIG, CHOLHDL, LDLDIRECT in the last 72 hours. Thyroid Function Tests: No results for input(s): TSH, T4TOTAL, FREET4, T3FREE, THYROIDAB in the last 72 hours. Anemia Panel: No results for input(s): VITAMINB12, FOLATE, FERRITIN, TIBC, IRON, RETICCTPCT in the last 72 hours. Urine analysis:    Component Value Date/Time   BILIRUBINUR small 08/08/2014 1455   PROTEINUR 30 08/08/2014 1455   UROBILINOGEN 1.0 08/08/2014 1455   NITRITE neg 08/08/2014 1455   LEUKOCYTESUR Negative 08/08/2014 1455    Creatinine Clearance: Estimated Creatinine Clearance: 140.7 mL/min (by C-G formula based on Cr of 0.94).  Sepsis Labs: @LABRCNTIP (procalcitonin:4,lacticidven:4) )No results found for this or any previous visit (from the past 240 hour(s)).   Radiological Exams on Admission: Dg Chest Port 1 View  12/13/2015  CLINICAL DATA:  Chest pain for 1 day. EXAM: PORTABLE CHEST 1 VIEW COMPARISON:  None. FINDINGS: The heart size and mediastinal contours are within normal limits. Both lungs are clear. The visualized skeletal structures are unremarkable. IMPRESSION: No active disease. Electronically Signed   By: Norva PavlovElizabeth  Brown M.D.   On: 12/13/2015 13:23    EKG: Independently reviewed.   Assessment/Plan Principal Problem:   Chest pain Active Problems:   Diabetes mellitus type 2 in obese (HCC)   Obesity, Class III, BMI 40-49.9 (morbid obesity) (HCC)   HTN (hypertension)   Chest pain.  Cardiac monitoring, serial ekg's  Nuclear medicine stress test.    .Sliding scale insulin for diabetes Continue blood pressure medications, lisinopril,     DVT prophylaxis: Heparin Code Status: Full Family Communication: none Disposition Plan: Discharge if normal troponins and normal stress test  Consults called:  Admission status: inpatient observation   [facility] J[facility] Va Medical CenterOFIA,KAREN PA-C [facility]  If 7PM-7AM, please contact night-coverage [email or web address] Password [email or web address]  12/13/2015, 3:49 PM

## 2015-12-14 ENCOUNTER — Observation Stay (HOSPITAL_COMMUNITY): Payer: 59

## 2015-12-14 DIAGNOSIS — E119 Type 2 diabetes mellitus without complications: Secondary | ICD-10-CM | POA: Diagnosis not present

## 2015-12-14 DIAGNOSIS — R079 Chest pain, unspecified: Secondary | ICD-10-CM | POA: Diagnosis not present

## 2015-12-14 DIAGNOSIS — I10 Essential (primary) hypertension: Secondary | ICD-10-CM

## 2015-12-14 DIAGNOSIS — E8881 Metabolic syndrome: Secondary | ICD-10-CM | POA: Diagnosis not present

## 2015-12-14 DIAGNOSIS — E669 Obesity, unspecified: Secondary | ICD-10-CM | POA: Diagnosis not present

## 2015-12-14 DIAGNOSIS — R931 Abnormal findings on diagnostic imaging of heart and coronary circulation: Secondary | ICD-10-CM | POA: Diagnosis not present

## 2015-12-14 DIAGNOSIS — I209 Angina pectoris, unspecified: Secondary | ICD-10-CM | POA: Diagnosis not present

## 2015-12-14 LAB — CBC
HCT: 39.5 % (ref 39.0–52.0)
HEMOGLOBIN: 13.4 g/dL (ref 13.0–17.0)
MCH: 29.3 pg (ref 26.0–34.0)
MCHC: 33.9 g/dL (ref 30.0–36.0)
MCV: 86.2 fL (ref 78.0–100.0)
PLATELETS: 224 10*3/uL (ref 150–400)
RBC: 4.58 MIL/uL (ref 4.22–5.81)
RDW: 12.8 % (ref 11.5–15.5)
WBC: 6.9 10*3/uL (ref 4.0–10.5)

## 2015-12-14 LAB — LIPID PANEL
CHOL/HDL RATIO: 4.9 ratio
CHOLESTEROL: 147 mg/dL (ref 0–200)
HDL: 30 mg/dL — AB (ref >40)
LDL Cholesterol: 88 mg/dL (ref 0–99)
TRIGLYCERIDES: 146 mg/dL (ref <150)
VLDL: 29 mg/dL (ref 0–40)

## 2015-12-14 LAB — GLUCOSE, CAPILLARY
GLUCOSE-CAPILLARY: 113 mg/dL — AB (ref 65–99)
GLUCOSE-CAPILLARY: 130 mg/dL — AB (ref 65–99)
GLUCOSE-CAPILLARY: 132 mg/dL — AB (ref 65–99)
GLUCOSE-CAPILLARY: 130 mg/dL — AB (ref 65–99)

## 2015-12-14 MED ORDER — MORPHINE SULFATE (PF) 2 MG/ML IV SOLN: 1.0000 mg | INTRAVENOUS | Status: DC | PRN

## 2015-12-14 MED ORDER — TECHNETIUM TC 99M TETROFOSMIN IV KIT
30.0000 | PACK | Freq: Once | INTRAVENOUS | Status: AC | PRN
  Administered 2015-12-14: 30 via INTRAVENOUS

## 2015-12-14 NOTE — Progress Notes (Signed)
PROGRESS NOTE    Randall Morton  [id number]  DOB: October 16, 1970  DOA: 12/13/2015 PCP: Pcp Not In System Outpatient Specialists:   Hospital course: Randall PrestoCarl S Lewter is a 45 y.o. male with medical history significant of hypertension and Diabetes mellitus type II. Pt reports he began having chest pain this am. Pt reports multiple episodes lasting less than 10 minutes each. Pt went to Urgent care. Pt reports he was given 4 baby aspirin's and pain resolved. Pt reports no current chest pain. Pt denies any recent injury. No heavy lifting. Pt denies any previous chest pain. Pt denies any shortness of breath. Pt denies fever or cough. He denies any recent illness. Pt reports his father had a heart attack. He states his father was told it was because of his diabetes. Pt's mother has had a cardiac cath but no history of heart attack. Pt does not smoke   Assessment & Plan:   Chest pain. Cardiac monitoring, serial ekg'sno acute findings, ruled out by troponins.   Nuclear medicine stress test. - Pt will need a 2 days test as he is over 300 pounds.  Unfortunately he ate this morning and may not be able to have the est today.  It is possible that they can do the first part this afternoon after 6 hours NPO.    Diabetes mellitus type 2 in obese  - continue sliding scale coverage as needed, follow CBG CBG (last 3)   Recent Labs  12/13/15 1652 12/13/15 2027 12/14/15 0736  GLUCAP 77 190* 113*    Obesity, Class III, BMI 40-49.9 (morbid obesity) (HCC)  HTN (hypertension) - blood pressures stable, controlled - continue blood pressure medications, lisinopril,   DVT prophylaxis: Heparin Code Status: Full Family Communication: none Disposition Plan: Discharge if normal troponins and normal stress test  Consults called:  Admission status: inpatient observation  Procedures:  Nuclear stress test planned (2 day)   Subjective: Pt having no more chest pain.  No complaints.    Objective: Filed Vitals:   12/13/15 1600 12/13/15 1648 12/13/15 2141 12/14/15 0451  BP: 135/82 135/83 131/89 138/93  Pulse: 83 83 81 80  Temp:  97.7 F (36.5 C) 98.5 F (36.9 C) 98.1 F (36.7 C)  TempSrc:  Oral Oral Oral  Resp: 15 18 18 18   Height:  6\' 2"  (1.88 m)    Weight:  305 lb 14.4 oz (138.755 kg)  305 lb 1.6 oz (138.392 kg)  SpO2: 99% 100% 100% 100%    Intake/Output Summary (Last 24 hours) at 12/14/15 0839 Last data filed at 12/14/15 0454  Gross per 24 hour  Intake    240 ml  Output    500 ml  Net   -260 ml   Filed Weights   12/13/15 1217 12/13/15 1648 12/14/15 0451  Weight: 300 lb (136.079 kg) 305 lb 14.4 oz (138.755 kg) 305 lb 1.6 oz (138.392 kg)    Exam:  General exam: awake, alert, no distress, cooperative Respiratory system: Clear. No increased work of breathing. Cardiovascular system: S1 & S2 heard, RRR. No JVD, murmurs, gallops, clicks or pedal edema. Gastrointestinal system: Abdomen is nondistended, soft and nontender. Normal bowel sounds heard. Central nervous system: Alert and oriented. No focal neurological deficits. Extremities: trace peripheral edema.  Data Reviewed: Basic Metabolic Panel:  Recent Labs Lab 12/13/15 1218  NA 138  K 4.0  CL 108  CO2 24  GLUCOSE 143*  BUN 10  CREATININE 0.94  CALCIUM 9.0   Liver Function Tests:  Recent Labs Lab 12/13/15 1218  AST 23  ALT 22  ALKPHOS 71  BILITOT 0.5  PROT 7.5  ALBUMIN 3.6   No results for input(s): LIPASE, AMYLASE in the last 168 hours. No results for input(s): AMMONIA in the last 168 hours. CBC:  Recent Labs Lab 12/13/15 1218 12/14/15 0417  WBC 7.3 6.9  NEUTROABS 3.4  --   HGB 14.5 13.4  HCT 41.7 39.5  MCV 85.5 86.2  PLT 244 224   Cardiac Enzymes:  Recent Labs Lab 12/13/15 1644 12/13/15 1919 12/13/15 2251  TROPONINI <0.03 <0.03 <0.03   BNP (last 3 results) No results for input(s): PROBNP in the last 8760 hours. CBG:  Recent Labs Lab 12/13/15 1652  12/13/15 2027 12/14/15 0736  GLUCAP 77 190* 113*    No results found for this or any previous visit (from the past 240 hour(s)).   Studies: Dg Chest Port 1 View  12/13/2015  CLINICAL DATA:  Chest pain for 1 day. EXAM: PORTABLE CHEST 1 VIEW COMPARISON:  None. FINDINGS: The heart size and mediastinal contours are within normal limits. Both lungs are clear. The visualized skeletal structures are unremarkable. IMPRESSION: No active disease. Electronically Signed   By: Norva PavlovElizabeth  Brown M.D.   On: 12/13/2015 13:23   Scheduled Meds: . aspirin  324 mg Oral Daily  . heparin subcutaneous  5,000 Units Subcutaneous Q8H  . insulin aspart  0-20 Units Subcutaneous TID WC  . insulin aspart  0-5 Units Subcutaneous QHS  . lisinopril  10 mg Oral Daily   Continuous Infusions: . sodium chloride 75 mL/hr at 12/13/15 1704   Principal Problem:   Chest pain Active Problems:   Diabetes mellitus type 2 in obese (HCC)   Obesity, Class III, BMI 40-49.9 (morbid obesity) (HCC)   HTN (hypertension)  Time spent:   Standley Dakinslanford Rainah Kirshner, MD, FAAFP [facility] Pager [phone]  If 7PM-7AM, please contact night-coverage [email or web address] Password [email or web address] 12/14/2015, 8:39 AM

## 2015-12-15 DIAGNOSIS — R9439 Abnormal result of other cardiovascular function study: Secondary | ICD-10-CM | POA: Diagnosis not present

## 2015-12-15 DIAGNOSIS — R079 Chest pain, unspecified: Secondary | ICD-10-CM | POA: Diagnosis not present

## 2015-12-15 DIAGNOSIS — I209 Angina pectoris, unspecified: Secondary | ICD-10-CM | POA: Diagnosis not present

## 2015-12-15 DIAGNOSIS — R072 Precordial pain: Secondary | ICD-10-CM

## 2015-12-15 DIAGNOSIS — E119 Type 2 diabetes mellitus without complications: Secondary | ICD-10-CM | POA: Diagnosis not present

## 2015-12-15 DIAGNOSIS — R0789 Other chest pain: Secondary | ICD-10-CM | POA: Diagnosis not present

## 2015-12-15 DIAGNOSIS — I10 Essential (primary) hypertension: Secondary | ICD-10-CM | POA: Diagnosis not present

## 2015-12-15 DIAGNOSIS — R931 Abnormal findings on diagnostic imaging of heart and coronary circulation: Secondary | ICD-10-CM

## 2015-12-15 DIAGNOSIS — E786 Lipoprotein deficiency: Secondary | ICD-10-CM | POA: Diagnosis present

## 2015-12-15 LAB — NM MYOCAR MULTI W/SPECT W/WALL MOTION / EF
CHL CUP RESTING HR STRESS: 81 {beats}/min
Peak HR: 109 {beats}/min

## 2015-12-15 LAB — GLUCOSE, CAPILLARY
GLUCOSE-CAPILLARY: 177 mg/dL — AB (ref 65–99)
Glucose-Capillary: 122 mg/dL — ABNORMAL HIGH (ref 65–99)
Glucose-Capillary: 138 mg/dL — ABNORMAL HIGH (ref 65–99)
Glucose-Capillary: 126 mg/dL — ABNORMAL HIGH (ref 65–99)

## 2015-12-15 MED ORDER — REGADENOSON 0.4 MG/5ML IV SOLN
INTRAVENOUS | Status: AC
  Filled 2015-12-15: qty 5

## 2015-12-15 MED ORDER — TECHNETIUM TC 99M TETROFOSMIN IV KIT
30.0000 | PACK | Freq: Once | INTRAVENOUS | Status: AC | PRN
  Administered 2015-12-15: 30 via INTRAVENOUS

## 2015-12-15 MED ORDER — LISINOPRIL 20 MG PO TABS
20.0000 mg | ORAL_TABLET | Freq: Every day | ORAL | Status: DC
  Administered 2015-12-16: 20 mg via ORAL
  Filled 2015-12-15 (×2): qty 1

## 2015-12-15 MED ORDER — REGADENOSON 0.4 MG/5ML IV SOLN
0.4000 mg | Freq: Once | INTRAVENOUS | Status: AC
  Administered 2015-12-15: 0.4 mg via INTRAVENOUS
  Filled 2015-12-15: qty 5

## 2015-12-15 NOTE — Consult Note (Signed)
CARDIOLOGY CONSULT NOTE       Patient ID: Randall Morton MRN: [id number] DOB/AGE: [birth year] 45 y.o.  Admit date: 12/13/2015 Referring Physician: Laural BenesJohnson Primary Physician: Pcp Not In System Primary Cardiologist:  New Reason for Consultation: Chest Pain  Principal Problem:   Chest pain Active Problems:   Diabetes mellitus type 2 in obese (HCC)   Obesity, Class III, BMI 40-49.9 (morbid obesity) (HCC)   HTN (hypertension)    HPI:  45 y.o. with HTN, DM family history of CAD ( dad MI age 47 ) admitted with chest pain.  Fleeting pressure at work. Under a lot of stress. Pain intermittent took aspirin but recurred No history of CAD. Some associated urge to defecate Compliant with meds High salt diet. Pain free in hospital.  Has r/o with no acute ECG changes. Had resting portion of myovue  Yesterday and is going for stress portion this am.  Non smoker on ACE for BP.  Pain was not positional or pleuritic. Has not had before. Currently improved.  CXR NAD no CE  ROS All other systems reviewed and negative except as noted above  Past Medical History  Diagnosis Date  . Diabetes mellitus without complication (HCC)   . Hypertension   . Diabetic retinopathy (HCC)     last checked on 04/2014  . Osteomyelitis (HCC)     left foot ulcer--> osteo    Family History  Problem Relation Age of Onset  . Asthma Mother   . Diabetes Father   . Heart disease Father   . Hyperlipidemia Father   . Hypertension Father   . Stroke Father   . Prostate cancer Paternal Grandfather     Social History   Social History  . Marital Status: Single    Spouse Name: N/A  . Number of Children: N/A  . Years of Education: N/A   Occupational History  . Not on file.   Social History Main Topics  . Smoking status: Never Smoker   . Smokeless tobacco: Not on file  . Alcohol Use: 0.0 oz/week    0 Standard drinks or equivalent per week  . Drug Use: No  . Sexual Activity: Not on file   Other Topics Concern  .  Not on file   Social History Narrative    History reviewed. No pertinent past surgical history.   [name]. aspirin  324 mg Oral Daily  . heparin subcutaneous  5,000 Units Subcutaneous Q8H  . insulin aspart  0-20 Units Subcutaneous TID WC  . insulin aspart  0-5 Units Subcutaneous QHS  . lisinopril  10 mg Oral Daily      Physical Exam: Blood pressure 136/84, pulse 81, temperature 98 F (36.7 C), temperature source Oral, resp. rate 19, height 6\' 2"  (1.88 m), weight 304 lb 12.8 oz (138.256 kg), SpO2 100 %.   Affect appropriate Obese black male  HEENT: normal Neck supple with no adenopathy JVP normal no bruits no thyromegaly Lungs clear with no wheezing and good diaphragmatic motion Heart:  S1/S2 no murmur, no rub, gallop or click PMI normal Abdomen: benighn, BS positve, no tenderness, no AAA no bruit.  No HSM or HJR Distal pulses intact with no bruits No edema Neuro non-focal Skin warm and dry No muscular weakness   Labs:   Lab Results  Component Value Date   WBC 6.9 12/14/2015   HGB 13.4 12/14/2015   HCT 39.5 12/14/2015   MCV 86.2 12/14/2015   PLT 224 12/14/2015    Recent Labs Lab  12/13/15 1218  NA 138  K 4.0  CL 108  CO2 24  BUN 10  CREATININE 0.94  CALCIUM 9.0  PROT 7.5  BILITOT 0.5  ALKPHOS 71  ALT 22  AST 23  GLUCOSE 143*   Lab Results  Component Value Date   TROPONINI <0.03 12/13/2015    Lab Results  Component Value Date   CHOL 147 12/14/2015   CHOL 129 09/08/2007   Lab Results  Component Value Date   HDL 30* 12/14/2015   HDL 20.4* 09/08/2007   Lab Results  Component Value Date   LDLCALC 88 12/14/2015   LDLCALC 84 09/08/2007   Lab Results  Component Value Date   TRIG 146 12/14/2015   TRIG 122 09/08/2007   Lab Results  Component Value Date   CHOLHDL 4.9 12/14/2015   CHOLHDL 6.3 CALC 09/08/2007   Lab Results  Component Value Date   LDLDIRECT 91 08/26/2013      Radiology: Dg Chest Port 1 View  12/13/2015  CLINICAL DATA:  Chest  pain for 1 day. EXAM: PORTABLE CHEST 1 VIEW COMPARISON:  None. FINDINGS: The heart size and mediastinal contours are within normal limits. Both lungs are clear. The visualized skeletal structures are unremarkable. IMPRESSION: No active disease. Electronically Signed   By: Norva PavlovElizabeth  Brown M.D.   On: 12/13/2015 13:23    EKG: SR rate 85 normal   ASSESSMENT AND PLAN:  Chest Pain: atypical resolved r/o normal ECG and normal CXR  Could have had regular ETT.  For 2nd part of myovue today. D/c home if normal HTN:  Well controlled.  Continue current medications and low sodium Dash type diet.   DM:  Discussed low carb diet.  Target hemoglobin A1c is 6.5 or less.  Continue current medications. Lab Results  Component Value Date   HGBA1C 10.4 08/26/2013  DVT:  Prophylaxis can stop patient can be OOB and ambulate  D/C home latter today if myovue normal radiology to read   Signed: Charlton Hawseter Jovanni Rash 12/15/2015, 7:38 AM

## 2015-12-15 NOTE — Progress Notes (Signed)
    Stress test returned intermediate risk. Reviewed images with Dr. Delton SeeNelson who felt the findings were c/w ischemia. Will place on cath board for Monday.  I have reviewed the risks, indications, and alternatives to cardiac catheterization and possible angioplasty/stenting with the patient. Risks include but are not limited to bleeding, infection, vascular injury, stroke, myocardial infection, arrhythmia, kidney injury, radiation-related injury in the case of prolonged fluoroscopy use, emergency cardiac surgery, and death. The patient understands the risks of serious complication is low (<1%).     Cline CrockKathryn Trajon Rosete PA-C  [facility]

## 2015-12-15 NOTE — Progress Notes (Signed)
     The patient was seen in nuclear medicine for a lexiscan myoview. He tolerated the procedure well. No acute ST or TW changes on ECG. Await nuclear images.    Haydon Kalmar Stern PA-C  [facility]    

## 2015-12-15 NOTE — Progress Notes (Signed)
PROGRESS NOTE    Randall Morton  ZOX:[id number]  DOB: Aug 09, 1970  DOA: 12/13/2015 PCP: Pcp Not In System Outpatient Specialists:  Hospital course: Randall Morton is a 45 y.o. male with medical history significant of hypertension and Diabetes mellitus type II. Pt reports he began having chest pain this am. Pt reports multiple episodes lasting less than 10 minutes each. Pt went to Urgent care. Pt reports he was given 4 baby aspirin's and pain resolved. Pt reports no current chest pain. Pt denies any recent injury. No heavy lifting. Pt denies any previous chest pain. Pt denies any shortness of breath. Pt denies fever or cough. He denies any recent illness. Pt reports his father had a heart attack. He states his father was told it was because of his diabetes. Pt's mother has had a cardiac cath but no history of heart attack. Pt does not smoke   Assessment & Plan:   Chest pain. No further CP symptoms reported. Continue. Cardiac monitoring, serial ekg'sno acute findings, ruled out by troponins.   Nuclear medicine stress test shows reversible ischemia.  Cardiology is planning to cath on 6/26.     Diabetes mellitus type 2 in obese  - continue sliding scale coverage as needed, follow CBG.  Blood sugars have been well controlled.   CBG (last 3)   Recent Labs  12/14/15 2132 12/15/15 0731 12/15/15 1150  GLUCAP 130* 122* 138*    Obesity, Class III, BMI 40-49.9 (morbid obesity) (HCC)  HTN (hypertension) - blood pressures stable but suboptimally controlled.  - continue blood pressure medications, lisinopril,   DVT prophylaxis: Heparin Code Status: Full Family Communication: none Disposition Plan: Discharge if normal troponins and normal stress test  Consults called:  Admission status: inpatient observation  Procedures:  Nuclear stress test (2 day) 6/23 and 6/24  Cath planned 6/26   Subjective: Pt having no more chest pain. No complaints.    Objective: Filed Vitals:   12/15/15 0955 12/15/15 0956 12/15/15 0958 12/15/15 0959  BP: 142/91 145/79 145/83   Pulse: 108 102 100 98  Temp:      TempSrc:      Resp:      Height:      Weight:      SpO2:        Intake/Output Summary (Last 24 hours) at 12/15/15 1245 Last data filed at 12/15/15 0640  Gross per 24 hour  Intake    480 ml  Output   1250 ml  Net   -770 ml   Filed Weights   12/13/15 1648 12/14/15 0451 12/15/15 0500  Weight: 305 lb 14.4 oz (138.755 kg) 305 lb 1.6 oz (138.392 kg) 304 lb 12.8 oz (138.256 kg)    Exam:  General exam: awake, alert, no distress, cooperative Respiratory system: Clear. No increased work of breathing. Cardiovascular system: S1 & S2 heard, RRR. No JVD, murmurs, gallops, clicks or pedal edema. Gastrointestinal system: Abdomen is nondistended, soft and nontender. Normal bowel sounds heard. Central nervous system: Alert and oriented. No focal neurological deficits. Extremities: trace peripheral edema.  Data Reviewed: Basic Metabolic Panel:  Recent Labs Lab 12/13/15 1218  NA 138  K 4.0  CL 108  CO2 24  GLUCOSE 143*  BUN 10  CREATININE 0.94  CALCIUM 9.0   Liver Function Tests:  Recent Labs Lab 12/13/15 1218  AST 23  ALT 22  ALKPHOS 71  BILITOT 0.5  PROT 7.5  ALBUMIN 3.6   No results for input(s): LIPASE, AMYLASE in the last  168 hours. No results for input(s): AMMONIA in the last 168 hours. CBC:  Recent Labs Lab 12/13/15 1218 12/14/15 0417  WBC 7.3 6.9  NEUTROABS 3.4  --   HGB 14.5 13.4  HCT 41.7 39.5  MCV 85.5 86.2  PLT 244 224   Cardiac Enzymes:  Recent Labs Lab 12/13/15 1644 12/13/15 1919 12/13/15 2251  TROPONINI <0.03 <0.03 <0.03   BNP (last 3 results) No results for input(s): PROBNP in the last 8760 hours. CBG:  Recent Labs Lab 12/14/15 1213 12/14/15 1625 12/14/15 2132 12/15/15 0731 12/15/15 1150  GLUCAP 130* 132* 130* 122* 138*    No results found for this or any previous visit (from the past 240 hour(s)).    Studies: Nm Myocar Multi W/spect W/wall Motion / Ef  12/15/2015  CLINICAL DATA:  Chest pain, diabetes mellitus, hypertension, no cardiac history EXAM: MYOCARDIAL IMAGING WITH SPECT (REST AND PHARMACOLOGIC-STRESS - 2 DAY PROTOCOL) GATED LEFT VENTRICULAR WALL MOTION STUDY LEFT VENTRICULAR EJECTION FRACTION TECHNIQUE: Standard myocardial SPECT imaging was performed after resting intravenous injection of 10 mCi Tc-452m tetrofosmin. Subsequently, on a second day, intravenous infusion of Lexiscan was performed under the supervision of the Cardiology staff. At peak effect of the drug, 30 mCi Tc-452m tetrofosmin was injected intravenously and standard myocardial SPECT imaging was performed. Quantitative gated imaging was also performed to evaluate left ventricular wall motion, and estimate left ventricular ejection fraction. COMPARISON:  None FINDINGS: Perfusion: Decreased myocardial perfusion involving the inferior wall LEFT ventricle slightly greater toward inferolateral wall. Additional subtle decreased perfusion seen at the anteroseptal portion of the LEFT ventricular apex. Both of these sites demonstrate improved perfusion on resting images. Wall Motion: Normal left ventricular wall motion. No left ventricular dilation. Left Ventricular Ejection Fraction: 60 % End diastolic volume 114 ml End systolic volume 45 ml IMPRESSION: 1. Reversible decreased myocardial perfusion involving the inferior wall LEFT ventricle and subtly at the anteroseptal portion of the LEFT ventricular apex. 2. Normal left ventricular wall motion. 3. Left ventricular ejection fraction 70% 4. Non invasive risk stratification*: Intermediate *2012 Appropriate Use Criteria for Coronary Revascularization Focused Update: J Am Coll Cardiol. 2012;59(9):857-881. http://content.dementiazones.comonlinejacc.org/article.aspx?articleid=[email or web address] Electronically Signed   By: Ulyses SouthwardMark  Boles M.D.   On: 12/15/2015 12:27   Dg Chest Port 1 View  12/13/2015  CLINICAL DATA:  Chest pain  for 1 day. EXAM: PORTABLE CHEST 1 VIEW COMPARISON:  None. FINDINGS: The heart size and mediastinal contours are within normal limits. Both lungs are clear. The visualized skeletal structures are unremarkable. IMPRESSION: No active disease. Electronically Signed   By: Norva PavlovElizabeth  Brown M.D.   On: 12/13/2015 13:23   Scheduled Meds: . aspirin  324 mg Oral Daily  . heparin subcutaneous  5,000 Units Subcutaneous Q8H  . insulin aspart  0-20 Units Subcutaneous TID WC  . insulin aspart  0-5 Units Subcutaneous QHS  . lisinopril  10 mg Oral Daily  . regadenoson       Continuous Infusions:   Principal Problem:   Chest pain Active Problems:   Diabetes mellitus type 2 in obese (HCC)   Obesity, Class III, BMI 40-49.9 (morbid obesity) (HCC)   HTN (hypertension)  Time spent:   Standley Dakinslanford Johnson, MD, FAAFP [facility] Pager [phone]  If 7PM-7AM, please contact night-coverage [email or web address] Password [email or web address] 12/15/2015, 12:45 PM

## 2015-12-16 DIAGNOSIS — I209 Angina pectoris, unspecified: Secondary | ICD-10-CM

## 2015-12-16 DIAGNOSIS — E119 Type 2 diabetes mellitus without complications: Secondary | ICD-10-CM | POA: Diagnosis not present

## 2015-12-16 DIAGNOSIS — R931 Abnormal findings on diagnostic imaging of heart and coronary circulation: Secondary | ICD-10-CM | POA: Diagnosis not present

## 2015-12-16 DIAGNOSIS — E8881 Metabolic syndrome: Secondary | ICD-10-CM | POA: Diagnosis not present

## 2015-12-16 DIAGNOSIS — E786 Lipoprotein deficiency: Secondary | ICD-10-CM

## 2015-12-16 LAB — GLUCOSE, CAPILLARY
GLUCOSE-CAPILLARY: 121 mg/dL — AB (ref 65–99)
GLUCOSE-CAPILLARY: 143 mg/dL — AB (ref 65–99)
Glucose-Capillary: 130 mg/dL — ABNORMAL HIGH (ref 65–99)

## 2015-12-16 MED ORDER — SODIUM CHLORIDE 0.9 % WEIGHT BASED INFUSION: 1.0000 mL/kg/h | INTRAVENOUS | Status: DC

## 2015-12-16 MED ORDER — SODIUM CHLORIDE 0.9% FLUSH: 3.0000 mL | Freq: Two times a day (BID) | INTRAVENOUS | Status: DC

## 2015-12-16 MED ORDER — SODIUM CHLORIDE 0.9 % IV SOLN: 250.0000 mL | INTRAVENOUS | Status: DC | PRN

## 2015-12-16 MED ORDER — SODIUM CHLORIDE 0.9 % WEIGHT BASED INFUSION
3.0000 mL/kg/h | INTRAVENOUS | Status: AC
  Administered 2015-12-17: 3 mL/kg/h via INTRAVENOUS

## 2015-12-16 MED ORDER — SODIUM CHLORIDE 0.9% FLUSH: 3.0000 mL | INTRAVENOUS | Status: DC | PRN

## 2015-12-16 MED ORDER — ASPIRIN 81 MG PO CHEW
81.0000 mg | CHEWABLE_TABLET | ORAL | Status: AC
  Administered 2015-12-17: 81 mg via ORAL
  Filled 2015-12-16: qty 1

## 2015-12-16 NOTE — Progress Notes (Signed)
Patient ID: Randall Morton Randall Morton, male   DOB: Nov 07, 1970, 45 y.o.   MRN: [id number]    Subjective:  Denies SSCP, palpitations or Dyspnea   Objective:  Filed Vitals:   12/15/15 0959 12/15/15 1332 12/15/15 2049 12/16/15 0500  BP:  138/85 139/87 125/90  Pulse: 98 87 90 93  Temp:  97.6 F (36.4 C) 98.4 F (36.9 C) 98.2 F (36.8 C)  TempSrc:  Oral    Resp:  15 18 19   Height:      Weight:    303 lb 6.4 oz (137.621 kg)  SpO2:  100% 100% 96%    Intake/Output from previous day:  Intake/Output Summary (Last 24 hours) at 12/16/15 0730 Last data filed at 12/16/15 0500  Gross per 24 hour  Intake    840 ml  Output   1100 ml  Net   -260 ml    Physical Exam: Affect appropriate Overweight black male  HEENT: normal Neck supple with no adenopathy JVP normal no bruits no thyromegaly Lungs clear with no wheezing and good diaphragmatic motion Heart:  S1/S2 no murmur, no rub, gallop or click PMI normal Abdomen: benighn, BS positve, no tenderness, no AAA no bruit.  No HSM or HJR Distal pulses intact with no bruits No edema Neuro non-focal Skin warm and dry No muscular weakness   Lab Results: Basic Metabolic Panel:  Recent Labs  11/91/4706/22/17 1218  NA 138  K 4.0  CL 108  CO2 24  GLUCOSE 143*  BUN 10  CREATININE 0.94  CALCIUM 9.0   Liver Function Tests:  Recent Labs  12/13/15 1218  AST 23  ALT 22  ALKPHOS 71  BILITOT 0.5  PROT 7.5  ALBUMIN 3.6   CBC:  Recent Labs  12/13/15 1218 12/14/15 0417  WBC 7.3 6.9  NEUTROABS 3.4  --   HGB 14.5 13.4  HCT 41.7 39.5  MCV 85.5 86.2  PLT 244 224   Cardiac Enzymes:  Recent Labs  12/13/15 1644 12/13/15 1919 12/13/15 2251  TROPONINI <0.03 <0.03 <0.03   Fasting Lipid Panel:  Recent Labs  12/14/15 0859  CHOL 147  HDL 30*  LDLCALC 88  TRIG [id number]  CHOLHDL 4.9    Imaging: Nm Myocar Multi W/spect W/wall Motion / Ef  12/15/2015  CLINICAL DATA:  Chest pain, diabetes mellitus, hypertension, no cardiac history EXAM:  MYOCARDIAL IMAGING WITH SPECT (REST AND PHARMACOLOGIC-STRESS - 2 DAY PROTOCOL) GATED LEFT VENTRICULAR WALL MOTION STUDY LEFT VENTRICULAR EJECTION FRACTION TECHNIQUE: Standard myocardial SPECT imaging was performed after resting intravenous injection of 10 mCi Tc-5547m tetrofosmin. Subsequently, on a second day, intravenous infusion of Lexiscan was performed under the supervision of the Cardiology staff. At peak effect of the drug, 30 mCi Tc-1047m tetrofosmin was injected intravenously and standard myocardial SPECT imaging was performed. Quantitative gated imaging was also performed to evaluate left ventricular wall motion, and estimate left ventricular ejection fraction. COMPARISON:  None FINDINGS: Perfusion: Decreased myocardial perfusion involving the inferior wall LEFT ventricle slightly greater toward inferolateral wall. Additional subtle decreased perfusion seen at the anteroseptal portion of the LEFT ventricular apex. Both of these sites demonstrate improved perfusion on resting images. Wall Motion: Normal left ventricular wall motion. No left ventricular dilation. Left Ventricular Ejection Fraction: 60 % End diastolic volume 114 ml End systolic volume 45 ml IMPRESSION: 1. Reversible decreased myocardial perfusion involving the inferior wall LEFT ventricle and subtly at the anteroseptal portion of the LEFT ventricular apex. 2. Normal left ventricular wall motion. 3. Left ventricular ejection fraction  70% 4. Non invasive risk stratification*: Intermediate *2012 Appropriate Use Criteria for Coronary Revascularization Focused Update: J Am Coll Cardiol. 2012;59(9):857-881. http://content.dementiazones.comonlinejacc.org/article.aspx?articleid=[email or web address] Electronically Signed   By: Ulyses SouthwardMark  Boles M.D.   On: 12/15/2015 12:27    Cardiac Studies:  ECG:  NSR no acute ST changes    Telemetry:  NSR 12/16/2015   Echo:   Medications:   . aspirin  324 mg Oral Daily  . heparin subcutaneous  5,000 Units Subcutaneous Q8H  . insulin aspart   0-20 Units Subcutaneous TID WC  . insulin aspart  0-5 Units Subcutaneous QHS  . lisinopril  20 mg Oral Daily       Assessment/Plan:  Chest Pain: reviewed myovue appears to have inferior wall ischemia. Cath on Monday risks including stroke, bleeding, MI and need for emergency surgery discussed willing to proceed Continue ASA DM:  Discussed low carb diet.  Target hemoglobin A1c is 6.5 or less.  Continue current medications. HTN:  Well controlled.  Continue current medications and low sodium Dash type diet.     Randall Morton 12/16/2015, 7:30 AM

## 2015-12-16 NOTE — Progress Notes (Signed)
PROGRESS NOTE    Scarlett PrestoCarl S Berne  ZOX:[id number]  DOB: 07-09-1970  DOA: 12/13/2015 PCP: Pcp Not In System Outpatient Specialists:  Hospital course: Scarlett PrestoCarl S Troupe is a 45 y.o. male with medical history significant of hypertension and Diabetes mellitus type II. Pt reports he began having chest pain this am. Pt reports multiple episodes lasting less than 10 minutes each. Pt went to Urgent care. Pt reports he was given 4 baby aspirin's and pain resolved. Pt reports no current chest pain. Pt denies any recent injury. No heavy lifting. Pt denies any previous chest pain. Pt denies any shortness of breath. Pt denies fever or cough. He denies any recent illness. Pt reports his father had a heart attack. He states his father was told it was because of his diabetes. Pt's mother has had a cardiac cath but no history of heart attack. Pt does not smoke   Assessment & Plan:   Chest pain. No further CP symptoms reported. Continue. Cardiac monitoring, serial ekg'sno acute findings, ruled out by troponins.   Nuclear medicine stress test shows reversible ischemia.  Cardiology is planning to cath on 6/26.     Diabetes mellitus type 2 in obese  - continue sliding scale coverage as needed, follow CBG.  Blood sugars have been well controlled.   CBG (last 3)   Recent Labs  12/15/15 2044 12/16/15 0721 12/16/15 1140  GLUCAP 126* 121* 143*   A1c still pending at this time.   Obesity, Class III, BMI 40-49.9 (morbid obesity) (HCC)  HTN (hypertension) - blood pressures improved.  - continue blood pressure medications, lisinopril,   DVT prophylaxis: Heparin Code Status: Full Family Communication: none Disposition Plan: pending  Consults called:  Admission status: inpatient observation  Procedures:  Nuclear stress test (2 day) 6/23 and 6/24  Cath planned 6/26   Subjective: Pt having no more chest pain. No complaints.  Pt says he is motivated to exercise more.    Objective: Filed Vitals:     12/15/15 0959 12/15/15 1332 12/15/15 2049 12/16/15 0500  BP:  138/85 139/87 125/90  Pulse: 98 87 90 93  Temp:  97.6 F (36.4 C) 98.4 F (36.9 C) 98.2 F (36.8 C)  TempSrc:  Oral    Resp:  15 18 19   Height:      Weight:    303 lb 6.4 oz (137.621 kg)  SpO2:  100% 100% 96%    Intake/Output Summary (Last 24 hours) at 12/16/15 1200 Last data filed at 12/16/15 0837  Gross per 24 hour  Intake   1200 ml  Output   1100 ml  Net    100 ml   Filed Weights   12/14/15 0451 12/15/15 0500 12/16/15 0500  Weight: 305 lb 1.6 oz (138.392 kg) 304 lb 12.8 oz (138.256 kg) 303 lb 6.4 oz (137.621 kg)    Exam:  General exam: awake, alert, no distress, cooperative Respiratory system: Clear. No increased work of breathing. Cardiovascular system: S1 & S2 heard, RRR. No JVD, murmurs, gallops, clicks or pedal edema. Gastrointestinal system: Abdomen is nondistended, soft and nontender. Normal bowel sounds heard. Central nervous system: Alert and oriented. No focal neurological deficits. Extremities: trace peripheral edema.  Data Reviewed: Basic Metabolic Panel:  Recent Labs Lab 12/13/15 1218  NA 138  K 4.0  CL 108  CO2 24  GLUCOSE 143*  BUN 10  CREATININE 0.94  CALCIUM 9.0   Liver Function Tests:  Recent Labs Lab 12/13/15 1218  AST 23  ALT 22  ALKPHOS 71  BILITOT 0.5  PROT 7.5  ALBUMIN 3.6   No results for input(s): LIPASE, AMYLASE in the last 168 hours. No results for input(s): AMMONIA in the last 168 hours. CBC:  Recent Labs Lab 12/13/15 1218 12/14/15 0417  WBC 7.3 6.9  NEUTROABS 3.4  --   HGB 14.5 13.4  HCT 41.7 39.5  MCV 85.5 86.2  PLT 244 224   Cardiac Enzymes:  Recent Labs Lab 12/13/15 1644 12/13/15 1919 12/13/15 2251  TROPONINI <0.03 <0.03 <0.03   BNP (last 3 results) No results for input(s): PROBNP in the last 8760 hours. CBG:  Recent Labs Lab 12/15/15 1150 12/15/15 1631 12/15/15 2044 12/16/15 0721 12/16/15 1140  GLUCAP 138* 177* 126* 121*  143*    No results found for this or any previous visit (from the past 240 hour(s)).   Studies: Nm Myocar Multi W/spect W/wall Motion / Ef  12/15/2015  CLINICAL DATA:  Chest pain, diabetes mellitus, hypertension, no cardiac history EXAM: MYOCARDIAL IMAGING WITH SPECT (REST AND PHARMACOLOGIC-STRESS - 2 DAY PROTOCOL) GATED LEFT VENTRICULAR WALL MOTION STUDY LEFT VENTRICULAR EJECTION FRACTION TECHNIQUE: Standard myocardial SPECT imaging was performed after resting intravenous injection of 10 mCi Tc-973m tetrofosmin. Subsequently, on a second day, intravenous infusion of Lexiscan was performed under the supervision of the Cardiology staff. At peak effect of the drug, 30 mCi Tc-2773m tetrofosmin was injected intravenously and standard myocardial SPECT imaging was performed. Quantitative gated imaging was also performed to evaluate left ventricular wall motion, and estimate left ventricular ejection fraction. COMPARISON:  None FINDINGS: Perfusion: Decreased myocardial perfusion involving the inferior wall LEFT ventricle slightly greater toward inferolateral wall. Additional subtle decreased perfusion seen at the anteroseptal portion of the LEFT ventricular apex. Both of these sites demonstrate improved perfusion on resting images. Wall Motion: Normal left ventricular wall motion. No left ventricular dilation. Left Ventricular Ejection Fraction: 60 % End diastolic volume 114 ml End systolic volume 45 ml IMPRESSION: 1. Reversible decreased myocardial perfusion involving the inferior wall LEFT ventricle and subtly at the anteroseptal portion of the LEFT ventricular apex. 2. Normal left ventricular wall motion. 3. Left ventricular ejection fraction 70% 4. Non invasive risk stratification*: Intermediate *2012 Appropriate Use Criteria for Coronary Revascularization Focused Update: J Am Coll Cardiol. 2012;59(9):857-881. http://content.dementiazones.comonlinejacc.org/article.aspx?articleid=[email or web address] Electronically Signed   By: Ulyses SouthwardMark  Boles M.D.    On: 12/15/2015 12:27   Scheduled Meds: . aspirin  324 mg Oral Daily  . heparin subcutaneous  5,000 Units Subcutaneous Q8H  . insulin aspart  0-20 Units Subcutaneous TID WC  . insulin aspart  0-5 Units Subcutaneous QHS  . lisinopril  20 mg Oral Daily   Continuous Infusions:   Principal Problem:   Chest pain Active Problems:   Diabetes mellitus type 2 in obese (HCC)   Dysmetabolic syndrome X   Abnormal nuclear stress test   Low HDL (under 40)   HTN (hypertension)   Obesity, Class III, BMI 40-49.9 (morbid obesity) (HCC)  Time spent:   Standley Dakinslanford Glendale Wherry, MD, FAAFP [facility] Pager [phone]  If 7PM-7AM, please contact night-coverage [email or web address] Password [email or web address] 12/16/2015, 12:00 PM

## 2015-12-17 ENCOUNTER — Encounter (HOSPITAL_COMMUNITY): Payer: Self-pay | Admitting: Cardiovascular Disease

## 2015-12-17 ENCOUNTER — Encounter (HOSPITAL_COMMUNITY)
Admission: EM | Disposition: A | Discharge status: Home / Self Care | Payer: Self-pay | Source: Home / Self Care | Attending: Emergency Medicine

## 2015-12-17 DIAGNOSIS — Z711 Person with feared health complaint in whom no diagnosis is made: Secondary | ICD-10-CM

## 2015-12-17 DIAGNOSIS — E8881 Metabolic syndrome: Secondary | ICD-10-CM | POA: Diagnosis not present

## 2015-12-17 DIAGNOSIS — R0789 Other chest pain: Secondary | ICD-10-CM

## 2015-12-17 DIAGNOSIS — E1340 Other specified diabetes mellitus with diabetic neuropathy, unspecified: Secondary | ICD-10-CM

## 2015-12-17 DIAGNOSIS — R079 Chest pain, unspecified: Secondary | ICD-10-CM | POA: Diagnosis not present

## 2015-12-17 DIAGNOSIS — E119 Type 2 diabetes mellitus without complications: Secondary | ICD-10-CM | POA: Diagnosis not present

## 2015-12-17 DIAGNOSIS — I209 Angina pectoris, unspecified: Secondary | ICD-10-CM | POA: Diagnosis not present

## 2015-12-17 DIAGNOSIS — R931 Abnormal findings on diagnostic imaging of heart and coronary circulation: Secondary | ICD-10-CM | POA: Diagnosis not present

## 2015-12-17 DIAGNOSIS — Z789 Other specified health status: Secondary | ICD-10-CM

## 2015-12-17 DIAGNOSIS — E11610 Type 2 diabetes mellitus with diabetic neuropathic arthropathy: Secondary | ICD-10-CM | POA: Diagnosis present

## 2015-12-17 HISTORY — PX: CARDIAC CATHETERIZATION: SHX172

## 2015-12-17 LAB — PROTIME-INR
INR: 1.04 (ref 0.00–1.49)
Prothrombin Time: 13.8 seconds (ref 11.6–15.2)

## 2015-12-17 LAB — HEMOGLOBIN A1C
Hgb A1c MFr Bld: 8.8 % — ABNORMAL HIGH (ref 4.8–5.6)
Mean Plasma Glucose: 206 mg/dL

## 2015-12-17 LAB — GLUCOSE, CAPILLARY
GLUCOSE-CAPILLARY: 185 mg/dL — AB (ref 65–99)
GLUCOSE-CAPILLARY: 220 mg/dL — AB (ref 65–99)
Glucose-Capillary: 121 mg/dL — ABNORMAL HIGH (ref 65–99)
Glucose-Capillary: 99 mg/dL (ref 65–99)

## 2015-12-17 SURGERY — LEFT HEART CATH AND CORONARY ANGIOGRAPHY
Anesthesia: LOCAL

## 2015-12-17 MED ORDER — ONDANSETRON HCL 4 MG/2ML IJ SOLN: 4.0000 mg | Freq: Four times a day (QID) | INTRAMUSCULAR | Status: DC | PRN

## 2015-12-17 MED ORDER — SODIUM CHLORIDE 0.9 % WEIGHT BASED INFUSION: 3.0000 mL/kg/h | INTRAVENOUS | Status: AC

## 2015-12-17 MED ORDER — FENTANYL CITRATE (PF) 100 MCG/2ML IJ SOLN
INTRAMUSCULAR | Status: AC
  Filled 2015-12-17: qty 2

## 2015-12-17 MED ORDER — LIDOCAINE HCL (PF) 1 % IJ SOLN
INTRAMUSCULAR | Status: DC | PRN
  Administered 2015-12-17: 2 mL via INTRADERMAL

## 2015-12-17 MED ORDER — HEPARIN (PORCINE) IN NACL 2-0.9 UNIT/ML-% IJ SOLN
INTRAMUSCULAR | Status: DC | PRN
  Administered 2015-12-17: 1500 mL

## 2015-12-17 MED ORDER — MIDAZOLAM HCL 2 MG/2ML IJ SOLN
INTRAMUSCULAR | Status: DC | PRN
  Administered 2015-12-17: 2 mg via INTRAVENOUS

## 2015-12-17 MED ORDER — FENTANYL CITRATE (PF) 100 MCG/2ML IJ SOLN
INTRAMUSCULAR | Status: DC | PRN
  Administered 2015-12-17: 25 ug via INTRAVENOUS

## 2015-12-17 MED ORDER — HEPARIN SODIUM (PORCINE) 1000 UNIT/ML IJ SOLN
INTRAMUSCULAR | Status: AC
  Filled 2015-12-17: qty 1

## 2015-12-17 MED ORDER — SODIUM CHLORIDE 0.9% FLUSH: 3.0000 mL | Freq: Two times a day (BID) | INTRAVENOUS | Status: DC

## 2015-12-17 MED ORDER — METFORMIN HCL 1000 MG PO TABS: 1000.0000 mg | ORAL_TABLET | Freq: Two times a day (BID) | ORAL | Status: DC

## 2015-12-17 MED ORDER — VERAPAMIL HCL 2.5 MG/ML IV SOLN
INTRAVENOUS | Status: DC | PRN
  Administered 2015-12-17: 10 mL via INTRA_ARTERIAL

## 2015-12-17 MED ORDER — LIDOCAINE HCL (PF) 1 % IJ SOLN
INTRAMUSCULAR | Status: AC
  Filled 2015-12-17: qty 30

## 2015-12-17 MED ORDER — OXYCODONE-ACETAMINOPHEN 5-325 MG PO TABS: 1.0000 | ORAL_TABLET | ORAL | Status: DC | PRN

## 2015-12-17 MED ORDER — SODIUM CHLORIDE 0.9 % IV SOLN: 250.0000 mL | INTRAVENOUS | Status: DC | PRN

## 2015-12-17 MED ORDER — HEPARIN SODIUM (PORCINE) 1000 UNIT/ML IJ SOLN
INTRAMUSCULAR | Status: DC | PRN
  Administered 2015-12-17: 5500 [IU] via INTRAVENOUS

## 2015-12-17 MED ORDER — VERAPAMIL HCL 2.5 MG/ML IV SOLN
INTRAVENOUS | Status: AC
  Filled 2015-12-17: qty 2

## 2015-12-17 MED ORDER — ACETAMINOPHEN 325 MG PO TABS: 650.0000 mg | ORAL_TABLET | ORAL | Status: DC | PRN

## 2015-12-17 MED ORDER — DIAZEPAM 2 MG PO TABS: 2.0000 mg | ORAL_TABLET | ORAL | Status: DC | PRN

## 2015-12-17 MED ORDER — MIDAZOLAM HCL 2 MG/2ML IJ SOLN
INTRAMUSCULAR | Status: AC
  Filled 2015-12-17: qty 2

## 2015-12-17 MED ORDER — HEPARIN (PORCINE) IN NACL 2-0.9 UNIT/ML-% IJ SOLN
INTRAMUSCULAR | Status: AC
  Filled 2015-12-17: qty 1000

## 2015-12-17 MED ORDER — SODIUM CHLORIDE 0.9% FLUSH: 3.0000 mL | INTRAVENOUS | Status: DC | PRN

## 2015-12-17 MED ORDER — LISINOPRIL 20 MG PO TABS: 20.0000 mg | ORAL_TABLET | Freq: Every day | ORAL | Status: DC

## 2015-12-17 SURGICAL SUPPLY — 11 items
CATH INFINITI 5 FR JL3.5 (CATHETERS) ×1 IMPLANT
CATH INFINITI 5FR ANG PIGTAIL (CATHETERS) ×1 IMPLANT
CATH INFINITI JR4 5F (CATHETERS) ×1 IMPLANT
DEVICE RAD COMP TR BAND LRG (VASCULAR PRODUCTS) ×1 IMPLANT
GLIDESHEATH SLEND SS 6F .021 (SHEATH) ×1 IMPLANT
KIT HEART LEFT (KITS) ×2 IMPLANT
PACK CARDIAC CATHETERIZATION (CUSTOM PROCEDURE TRAY) ×2 IMPLANT
SYR MEDRAD MARK V 150ML (SYRINGE) ×2 IMPLANT
TRANSDUCER W/STOPCOCK (MISCELLANEOUS) ×2 IMPLANT
TUBING CIL FLEX 10 FLL-RA (TUBING) ×2 IMPLANT
WIRE SAFE-T 1.5MM-J .035X260CM (WIRE) ×1 IMPLANT

## 2015-12-17 NOTE — Research (Signed)
[facility] Informed Consent   Subject Name: Randall Morton  Subject met inclusion and exclusion criteria.  The informed consent form, study requirements and expectations were reviewed with the subject and questions and concerns were addressed prior to the signing of the consent form.  The subject verbalized understanding of the trail requirements.  The subject agreed to participate in the CADLAD trial and signed the informed consent.  The informed consent was obtained prior to performance of any protocol-specific procedures for the subject.  A copy of the signed informed consent was given to the subject and a copy was placed in the subject's medical record.  [name] 12/17/2015, 0753 AM

## 2015-12-17 NOTE — Discharge Instructions (Signed)
Don't take metformin or ibuprofen until next Monday to protect kidneys Follow up with primary care physician in 1-2 weeks, call make appt Return if symptoms recur, worsen or new problems develop.   Blood Glucose Monitoring, Adult Monitoring your blood glucose (also know as blood sugar) helps you to manage your diabetes. It also helps you and your health care provider monitor your diabetes and determine how well your treatment plan is working. WHY SHOULD YOU MONITOR YOUR BLOOD GLUCOSE?  It can help you understand how food, exercise, and medicine affect your blood glucose.  It allows you to know what your blood glucose is at any given moment. You can quickly tell if you are having low blood glucose (hypoglycemia) or high blood glucose (hyperglycemia).  It can help you and your health care provider know how to adjust your medicines.  It can help you understand how to manage an illness or adjust medicine for exercise. WHEN SHOULD YOU TEST? Your health care provider will help you decide how often you should check your blood glucose. This may depend on the type of diabetes you have, your diabetes control, or the types of medicines you are taking. Be sure to write down all of your blood glucose readings so that this information can be reviewed with your health care provider. See below for examples of testing times that your health care provider may suggest. Type 1 Diabetes  Test at least 2 times per day if your diabetes is well controlled, if you are using an insulin pump, or if you perform multiple daily injections.  If your diabetes is not well controlled or if you are sick, you may need to test more often.  It is a good idea to also test:  Before every insulin injection.  Before and after exercise.  Between meals and 2 hours after a meal.  Occasionally between 2:00 a.m. and 3:00 a.m. Type 2 Diabetes  If you are taking insulin, test at least 2 times per day. However, it is best to test  before every insulin injection.  If you take medicines by mouth (orally), test 2 times a day.  If you are on a controlled diet, test once a day.  If your diabetes is not well controlled or if you are sick, you may need to monitor more often. HOW TO MONITOR YOUR BLOOD GLUCOSE Supplies Needed  Blood glucose meter.  Test strips for your meter. Each meter has its own strips. You must use the strips that go with your own meter.  A pricking needle (lancet).  A device that holds the lancet (lancing device).  A journal or log book to write down your results. Procedure  Wash your hands with soap and water. Alcohol is not preferred.  Prick the side of your finger (not the tip) with the lancet.  Gently milk the finger until a small drop of blood appears.  Follow the instructions that come with your meter for inserting the test strip, applying blood to the strip, and using your blood glucose meter. Other Areas to Get Blood for Testing Some meters allow you to use other areas of your body (other than your finger) to test your blood. These areas are called alternative sites. The most common alternative sites are:  The forearm.  The thigh.  The back area of the lower leg.  The palm of the hand. The blood flow in these areas is slower. Therefore, the blood glucose values you get may be delayed, and the numbers are  different from what you would get from your fingers. Do not use alternative sites if you think you are having hypoglycemia. Your reading will not be accurate. Always use a finger if you are having hypoglycemia. Also, if you cannot feel your lows (hypoglycemia unawareness), always use your fingers for your blood glucose checks. ADDITIONAL TIPS FOR GLUCOSE MONITORING  Do not reuse lancets.  Always carry your supplies with you.  All blood glucose meters have a 24-hour "hotline" number to call if you have questions or need help.  Adjust (calibrate) your blood glucose meter with a  control solution after finishing a few boxes of strips. BLOOD GLUCOSE RECORD KEEPING It is a good idea to keep a daily record or log of your blood glucose readings. Most glucose meters, if not all, keep your glucose records stored in the meter. Some meters come with the ability to download your records to your home computer. Keeping a record of your blood glucose readings is especially helpful if you are wanting to look for patterns. Make notes to go along with the blood glucose readings because you might forget what happened at that exact time. Keeping good records helps you and your health care provider to work together to achieve good diabetes management.    This information is not intended to replace advice given to you by your health care provider. Make sure you discuss any questions you have with your health care provider.   Document Released: 06/12/2003 Document Revised: 06/30/2014 Document Reviewed: 11/01/2012 Elsevier Interactive Patient Education 2016 [employer]  Chest Wall Pain Chest wall pain is pain in or around the bones and muscles of your chest. Sometimes, an injury causes this pain. Sometimes, the cause may not be known. This pain may take several weeks or longer to get better. HOME CARE Pay attention to any changes in your symptoms. Take these actions to help with your pain:  Rest as told by your doctor.  Avoid activities that cause pain. Try not to use your chest, belly (abdominal), or side muscles to lift heavy things.  If directed, apply ice to the painful area:  Put ice in a plastic bag.  Place a towel between your skin and the bag.  Leave the ice on for 20 minutes, 2-3 times per day.  Take over-the-counter and prescription medicines only as told by your doctor.  Do not use tobacco products, including cigarettes, chewing tobacco, and e-cigarettes. If you need help quitting, ask your doctor.  Keep all follow-up visits as told by your doctor. This is  important. GET HELP IF:  You have a fever.  Your chest pain gets worse.  You have new symptoms. GET HELP RIGHT AWAY IF:  You feel sick to your stomach (nauseous) or you throw up (vomit).  You feel sweaty or light-headed.  You have a cough with phlegm (sputum) or you cough up blood.  You are short of breath.   This information is not intended to replace advice given to you by your health care provider. Make sure you discuss any questions you have with your health care provider.   Document Released: 11/26/2007 Document Revised: 02/28/2015 Document Reviewed: 09/04/2014 Elsevier Interactive Patient Education 2016 [place].  Chest Pain Observation It is often hard to give a specific diagnosis for the cause of chest pain. Among other possibilities your symptoms might be caused by inadequate oxygen delivery to your heart (angina). Angina that is not treated or evaluated can lead to a heart attack (myocardial infarction) or  death. Blood tests, electrocardiograms, and X-rays may have been done to help determine a possible cause of your chest pain. After evaluation and observation, your health care provider has determined that it is unlikely your pain was caused by an unstable condition that requires hospitalization. However, a full evaluation of your pain may need to be completed, with additional diagnostic testing as directed. It is very important to keep your follow-up appointments. Not keeping your follow-up appointments could result in permanent heart damage, disability, or death. If there is any problem keeping your follow-up appointments, you must call your health care provider. HOME CARE INSTRUCTIONS  Due to the slight chance that your pain could be angina, it is important to follow your health care provider's treatment plan and also maintain a healthy lifestyle:  Maintain or work toward achieving a healthy weight.  Stay physically active and exercise regularly.  Decrease your  salt intake.  Eat a balanced, healthy diet. Talk to a dietitian to learn about heart-healthy foods.  Increase your fiber intake by including whole grains, vegetables, fruits, and nuts in your diet.  Avoid situations that cause stress, anger, or depression.  Take medicines as advised by your health care provider. Report any side effects to your health care provider. Do not stop medicines or adjust the dosages on your own.  Quit smoking. Do not use nicotine patches or gum until you check with your health care provider.  Keep your blood pressure, blood sugar, and cholesterol levels within normal limits.  Limit alcohol intake to no more than 1 drink per day for women who are not pregnant and 2 drinks per day for men.  Do not abuse drugs. SEEK IMMEDIATE MEDICAL CARE IF: You have severe chest pain or pressure which may include symptoms such as:  You feel pain or pressure in your arms, neck, jaw, or back.  You have severe back or abdominal pain, feel sick to your stomach (nauseous), or throw up (vomit).  You are sweating profusely.  You are having a fast or irregular heartbeat.  You feel short of breath while at rest.  You notice increasing shortness of breath during rest, sleep, or with activity.  You have chest pain that does not get better after rest or after taking your usual medicine.  You wake from sleep with chest pain.  You are unable to sleep because you cannot breathe.  You develop a frequent cough or you are coughing up blood.  You feel dizzy, faint, or experience extreme fatigue.  You develop severe weakness, dizziness, fainting, or chills. Any of these symptoms may represent a serious problem that is an emergency. Do not wait to see if the symptoms will go away. Call your local emergency services (911 in the [place] Do not drive yourself to the hospital. MAKE SURE YOU:  Understand these instructions.  Will watch your condition.  Will get help right away if you  are not doing well or get worse.   This information is not intended to replace advice given to you by your health care provider. Make sure you discuss any questions you have with your health care provider.   Document Released: 07/12/2010 Document Revised: 06/14/2013 Document Reviewed: 12/09/2012 Elsevier Interactive Patient Education 2016 [employer].  Diabetes and Exercise Diabetes mellitus is a common, chronic disease in which a person's blood sugar (glucose) levels are often too high. Getting exercise on a regular basis is an important part of diabetes treatment. WHY SHOULD I EXERCISE REGULARLY IF I HAVE DIABETES?  Exercising regularly provides many benefits for people who have diabetes. Some of these benefits include:  Lowering your blood glucose level.  Maintaining a healthy body weight and structure.  Reducing your risk of heart disease by:  Lowering your LDL cholesterol levels. LDL is sometimes called bad cholesterol.  Lowering your triglyceride levels.  Raising your HDL cholesterol levels. HDL is sometimes called good cholesterol.  Decreasing your blood pressure.  Lowering your hemoglobin A1C levels. A1C is a blood test that determines your average blood glucose level over a 89-monthperiod. Doing resistance exercises on a regular basis can help to lower these levels.  Improving your body's ability to use insulin and glucose. WHAT TYPES OF EXERCISE ARE RECOMMENDED FOR PEOPLE WHO HAVE DIABETES? A combination of resistance exercises and aerobic exercise is recommended for people who have diabetes. Resistance exercises are those that you do with free weights or weight machines. Aerobic exercise includes any exercise that raises your heart rate and breathing rate for a sustained amount of time. This can include activities such as:  Brisk walking.  Water or dry-land aerobics.  Bicycling or riding a stationary bike.  Jogging.  Dancing.  Hiking.  Moderate to vigorous  gardening. Ask your health care provider what types of exercise are safe for you. HOW LONG AND HOW OFTEN SHOULD I EXERCISE? The American Diabetes Association and the [place] Department of Health recommend the following:  Children who have diabetes or are at risk of developing diabetes (have prediabetes) should get 1 hour or more of exercise every day.  Adults who have diabetes should exercise with moderate intensity for 150 minutes or more per week. Moderate-intensity exercise is any exercise that raises your heart rate to 50-70% of your maximum heart rate. Ask your health care provider how to calculate this.  Adults who have diabetes should spread exercise over at least 3 days per week, but they should not go more than 2 days in a row without exercising.  Adults who have diabetes should do resistance exercise at least 2 days per week.  Adults who have diabetes should avoid sitting for more than 90 minutes at a time. WHAT SHOULD I DO BEFORE I START AN EXERCISE PROGRAM?  Talk with your health care provider before you start a new exercise program. If you have, or are at risk for, certain medical conditions, you may need to have a physical exam and testing. Testing may include:  A chest X-ray.  An electrocardiogram (ECG). This test checks the electrical functioning of your heart.  Blood tests.  If you have type 1 diabetes, talk with your health care provider about managing your blood glucose levels with insulin before, during, and after exercise.  Understand that exercise affects blood glucose levels differently depending on how long you exercise and depending on other factors such as whether you are sick. Be ready to treat high blood glucose (hyperglycemia) and low blood glucose (hypoglycemia) right away if either occurs during or after exercise.  Talk with your health care provider about diabetes-related problems that can occur during exercise. Your health care provider can help you to decide  on an exercise plan that allows you to avoid these problems. This plan might include instructions about choosing the proper footwear for exercise and staying well hydrated during and after exercise. HOW CAN I MANAGE MY BLOOD GLUCOSE LEVEL WHILE I EXERCISE?  Eat 1-3 hours before you exercise.  Check your blood glucose level immediately before and after exercise.  Do not start exercising  if:  Your blood glucose is more than 250 mg/dL andyou do not feel well.  You have ketones in your urine.  Your blood glucose is less than 100 mg/dL.  If you do not feel well while exercising, take a break and check your blood glucose.  Stop exercising if you find that your blood glucose has dropped below 100 mg/dL. If this occurs, do the following:  Eat a 15-gram to 20-gram carbohydrate-rich snack.  Recheck your blood glucose level.  If your blood glucose level does not rise above 100 mg/dL, have another 15-gram to 20-gram carbohydrate snack.  Stop exercising if youfindthat your blood glucose has risen above 250 mg/dL while exercising.  Do not go on with your workout until:  Your blood glucose level rises above 100 mg/dL if it was below this level.  Your blood glucose level drops below 250 mg/dL if it was above this level.  If you take insulin, you may need to alter your insulin schedule on the days that you exercise. This depends on how your blood glucose responds to exercise. Ask your health care provider how to do this.  Drink fluids during and after exercise to avoid dehydration. For exercise that lasts a long time, use a sports drink to maintain your glucose level. SEEK MEDICAL CARE IF:  You have stopped exercising because of hypoglycemia and your blood glucose does not rise above 100 mg/dL after you have two 15-gram to 20-gram carbohydrate-rich snacks.  You notice a loss of sensation in your feet during or after exercise.  You have increased numbness, tingling, or pins-and-needles  sensations after exercise.  You have a fast, irregular heartbeat (palpitations) during or after exercise.  Your exercise tolerance gets worse.  You have dizzy spells or feel faint for brief periods during or after exercise. SEEK IMMEDIATE MEDICAL CARE IF:  You have chest pain during or after exercise.  You pass out (lose consciousness) during or after exercise.  You have the following in addition to hyperglycemia:  Fatigue.  Dry or flushed skin.  Difficulty breathing.  Confusion.  Nausea, vomiting, or pain in the abdomen.  Fruity-smelling breath.   This information is not intended to replace advice given to you by your health care provider. Make sure you discuss any questions you have with your health care provider.   Document Released: 06/09/2005 Document Revised: 02/28/2015 Document Reviewed: 08/08/2014 Elsevier Interactive Patient Education 2016 [place].  Diabetes and Standards of Medical Care Diabetes is complicated. You may find that your diabetes team includes a dietitian, nurse, diabetes educator, eye doctor, and more. To help everyone know what is going on and to help you get the care you deserve, the following schedule of care was developed to help keep you on track. Below are the tests, exams, vaccines, medicines, education, and plans you will need. HbA1c test This test shows how well you have controlled your glucose over the past 2-3 months. It is used to see if your diabetes management plan needs to be adjusted.   It is performed at least 2 times a year if you are meeting treatment goals.  It is performed 4 times a year if therapy has changed or if you are not meeting treatment goals. Blood pressure test  This test is performed at every routine medical visit. The goal is less than 140/90 mm Hg for most people, but 130/80 mm Hg in some cases. Ask your health care provider about your goal. Dental exam  Follow up with the dentist  regularly. Eye exam  If  you are diagnosed with type 1 diabetes as a child, get an exam upon reaching the age of 20 years or older and having had diabetes for 3-5 years. Yearly eye exams are recommended after that initial eye exam.  If you are diagnosed with type 1 diabetes as an adult, get an exam within 5 years of diagnosis and then yearly.  If you are diagnosed with type 2 diabetes, get an exam as soon as possible after the diagnosis and then yearly. Foot care exam  Visual foot exams are performed at every routine medical visit. The exams check for cuts, injuries, or other problems with the feet.  You should have a complete foot exam performed every year. This exam includes an inspection of the structure and skin of your feet, a check of the pulses in your feet, and a check of the sensation in your feet.  Type 1 diabetes: The first exam is performed 5 years after diagnosis.  Type 2 diabetes: The first exam is performed at the time of diagnosis.  Check your feet nightly for cuts, injuries, or other problems with your feet. Tell your health care provider if anything is not healing. Kidney function test (urine microalbumin)  This test is performed once a year.  Type 1 diabetes: The first test is performed 5 years after diagnosis.  Type 2 diabetes: The first test is performed at the time of diagnosis.  A serum creatinine and estimated glomerular filtration rate (eGFR) test is done once a year to assess the level of chronic kidney disease (CKD), if present. Lipid profile (cholesterol, HDL, LDL, triglycerides)  Performed every 5 years for most people.  The goal for LDL is less than 100 mg/dL. If you are at high risk, the goal is less than 70 mg/dL.  The goal for HDL is 40 mg/dL-50 mg/dL for men and 50 mg/dL-60 mg/dL for women. An HDL cholesterol of 60 mg/dL or higher gives some protection against heart disease.  The goal for triglycerides is less than 150 mg/dL. Immunizations  The flu (influenza) vaccine is  recommended yearly for every person 54 months of age or older who has diabetes.  The pneumonia (pneumococcal) vaccine is recommended for every person 51 years of age or older who has diabetes. Adults 3 years of age or older may receive the pneumonia vaccine as a series of two separate shots.  The hepatitis B vaccine is recommended for adults shortly after they have been diagnosed with diabetes.  The Tdap (tetanus, diphtheria, and pertussis) vaccine should be given:  According to normal childhood vaccination schedules, for children.  Every 10 years, for adults who have diabetes. Diabetes self-management education  Education is recommended at diagnosis and ongoing as needed. Treatment plan  Your treatment plan is reviewed at every medical visit.   This information is not intended to replace advice given to you by your health care provider. Make sure you discuss any questions you have with your health care provider.   Document Released: 04/06/2009 Document Revised: 06/30/2014 Document Reviewed: 11/09/2012 Elsevier Interactive Patient Education 2016 [employer] Inc.  Insulin Resistance Insulin is a hormone made by the pancreas that controls blood sugar (glucose) levels. It is needed for your body to function normally. Insulin resistance occurs when your body does not respond well to the insulin made by your pancreas. Insulin resistance results in high blood glucose levels and can lead to problems, including:  Prediabetes.  Type 2 diabetes.  Heart disease.  High blood pressure (hypertension).  Stroke.  Polycystic ovary syndrome.  Fatty liver. CAUSES  The exact causes is unknown. RISK FACTORS  Being overweight or obese, especially if your weight is centered in the waist area.  Physical inactivity. SIGNS AND SYMPTOMS  There are usually no symptoms. DIAGNOSIS  There is no test to diagnose insulin resistance. However, your health care provider may suspect that you have insulin  resistance if you have:  High blood glucose levels.  Abnormal cholesterol levels.  A waist circumference of more than 35 inches for women and more than 40 inches for men.  The risk factors for insulin resistance. To make a diagnosis, your health care provider will perform a physical exam. During the exam, he or she will ask you questions about your health and medical history. TREATMENT  The most important treatment is lifestyle changes. Your health care provider can help you make the changes that are appropriate. These may include:  Eating less.  Being physically active.  Stopping the use of any tobacco products.  Taking medicine to help improve your insulin sensitivity. HOME CARE INSTRUCTIONS   Eat a healthy diet.  Choose healthy portion sizes.  Eat more vegetables.  Drink more water.  Cut back on high-fat toppings.  Eat foods that are low in fat, such as fruits, vegetables, and lean meats.  Be physically active to reach and maintain a healthy weight.  Exercise 4 days a week for 20-40 minutes at a time, or as directed by your health care provider.  Take the stairs instead of the elevator.  Walk around when you talk on the phone.  Walk your dog if you have one.  Take medicines only as directed by your health care provider.  Do not use any tobacco products, including cigarettes, chewing tobacco, or electronic cigarettes. If you need help quitting, ask your health care provider.  Keep all follow-up visits as directed by your health care provider. This is important. SEEK MEDICAL CARE IF:   You have trouble losing weight or maintaining your goal weight.  You gain weight.  You have trouble following your prescribed meal plan.  You cannot exercise or do your normal exercises.   This information is not intended to replace advice given to you by your health care provider. Make sure you discuss any questions you have with your health care provider.   Document  Released: 07/29/2005 Document Revised: 06/30/2014 Document Reviewed: 11/18/2012 Elsevier Interactive Patient Education 2016 [employer]. Exercising to [employer] Exercising can help you to lose weight. In order to lose weight through exercise, you need to do vigorous-intensity exercise. You can tell that you are exercising with vigorous intensity if you are breathing very hard and fast and cannot hold a conversation while exercising. Moderate-intensity exercise helps to maintain your current weight. You can tell that you are exercising at a moderate level if you have a higher heart rate and faster breathing, but you are still able to hold a conversation. HOW OFTEN SHOULD I EXERCISE? Choose an activity that you enjoy and set realistic goals. Your health care provider can help you to make an activity plan that works for you. Exercise regularly as directed by your health care provider. This may include:  Doing resistance training twice each week, such as:  Push-ups.  Sit-ups.  Lifting weights.  Using resistance bands.  Doing a given intensity of exercise for a given amount of time. Choose from these options:  150 minutes of moderate-intensity exercise every week.  75 minutes of vigorous-intensity exercise every week.  A mix of moderate-intensity and vigorous-intensity exercise every week. Children, pregnant women, people who are out of shape, people who are overweight, and older adults may need to consult a health care provider for individual recommendations. If you have any sort of medical condition, be sure to consult your health care provider before starting a new exercise program. WHAT ARE SOME ACTIVITIES THAT CAN HELP ME TO LOSE WEIGHT?   Walking at a rate of at least 4.5 miles an hour.  Jogging or running at a rate of 5 miles per hour.  Biking at a rate of at least 10 miles per hour.  Lap swimming.  Roller-skating or in-line skating.  Cross-country skiing.  Vigorous  competitive sports, such as football, basketball, and soccer.  Jumping rope.  Aerobic dancing. HOW CAN I BE MORE ACTIVE IN MY DAY-TO-DAY ACTIVITIES?  Use the stairs instead of the elevator.  Take a walk during your lunch break.  If you drive, park your car farther away from work or school.  If you take public transportation, get off one stop early and walk the rest of the way.  Make all of your phone calls while standing up and walking around.  Get up, stretch, and walk around every 30 minutes throughout the day. WHAT GUIDELINES SHOULD I FOLLOW WHILE EXERCISING?  Do not exercise so much that you hurt yourself, feel dizzy, or get very short of breath.  Consult your health care provider prior to starting a new exercise program.  Wear comfortable clothes and shoes with good support.  Drink plenty of water while you exercise to prevent dehydration or heat stroke. Body water is lost during exercise and must be replaced.  Work out until you breathe faster and your heart beats faster.   This information is not intended to replace advice given to you by your health care provider. Make sure you discuss any questions you have with your health care provider.   Document Released: 07/12/2010 Document Revised: 06/30/2014 Document Reviewed: 11/10/2013 Elsevier Interactive Patient Education 2016 [employer].   Metformin and IV Contrast Studies For some X-ray exams, a contrast dye is used. Contrast dye is a type of medicine used to make the X-ray image clearer. The contrast dye is given to the patient through a vein (intravenously). If you need to have this type of X-ray exam and you take a medication called metformin, your caregiver may have you stop taking metformin before the exam.  LACTIC ACIDOSIS In rare cases, a serious medical condition called lactic acidosis can develop in people who take metformin and receive contrast dye. The following conditions can increase the risk of this  complication:   Kidney failure.  Liver problems.  Certain types of heart problems such as:  Heart failure.  Heart attack.  Heart infection.  Heart valve problems.  Alcohol abuse. If left untreated, lactic acidosis can lead to coma.  SYMPTOMS OF LACTIC ACIDOSIS Symptoms of lactic acidosis can include:  Rapid breathing (hyperventilation).  Neurologic symptoms such as:  Headaches.  Confusion.  Dizziness.  Excessive sweating.  Feeling sick to your stomach (nauseous) or throwing up (vomiting). AFTER THE X-RAY EXAM  Stay well-hydrated. Drink fluids as instructed by your caregiver.  If you have a risk of developing lactic acidosis, blood tests may be done to make sure your kidney function is okay.  Metformin is usually stopped for 48 hours after the X-ray exam. Ask your caregiver when you can start taking metformin  again. SEEK MEDICAL CARE IF:   You have shortness of breath or difficulty breathing.  You develop a headache that does not go away.  You have nausea or vomiting.  You urinate more than normal.  You develop a skin rash and have:  Redness.  Swelling.  Itching.   This information is not intended to replace advice given to you by your health care provider. Make sure you discuss any questions you have with your health care provider.   Document Released: 05/28/2009 Document Revised: 10/24/2014 Document Reviewed: 05/28/2009 Elsevier Interactive Patient Education 2016 [place] [employer] Calories are energy you get from the things you eat and drink. Your body uses this energy to keep you going throughout the day. The number of calories you eat affects your weight. When you eat more calories than your body needs, your body stores the extra calories as fat. When you eat fewer calories than your body needs, your body burns fat to get the energy it needs. Calorie counting means keeping track of how many calories you eat and drink  each day. If you make sure to eat fewer calories than your body needs, you should lose weight. In order for calorie counting to work, you will need to eat the number of calories that are right for you in a day to lose a healthy amount of weight per week. A healthy amount of weight to lose per week is usually 1-2 lb (0.5-0.9 kg). A dietitian can determine how many calories you need in a day and give you suggestions on how to reach your calorie goal.  WHAT IS MY MY PLAN? My goal is to have __________ calories per day.  If I have this many calories per day, I should lose around __________ pounds per week. WHAT DO I NEED TO KNOW ABOUT CALORIE COUNTING? In order to meet your daily calorie goal, you will need to:  Find out how many calories are in each food you would like to eat. Try to do this before you eat.  Decide how much of the food you can eat.  Write down what you ate and how many calories it had. Doing this is called keeping a food log. WHERE DO I FIND CALORIE INFORMATION? The number of calories in a food can be found on a Nutrition Facts label. Note that all the information on a label is based on a specific serving of the food. If a food does not have a Nutrition Facts label, try to look up the calories online or ask your dietitian for help. HOW DO I DECIDE HOW MUCH TO EAT? To decide how much of the food you can eat, you will need to consider both the number of calories in one serving and the size of one serving. This information can be found on the Nutrition Facts label. If a food does not have a Nutrition Facts label, look up the information online or ask your dietitian for help. Remember that calories are listed per serving. If you choose to have more than one serving of a food, you will have to multiply the calories per serving by the amount of servings you plan to eat. For example, the label on a package of bread might say that a serving size is 1 slice and that there are 90 calories in a  serving. If you eat 1 slice, you will have eaten 90 calories. If you eat 2 slices, you will have eaten 180 calories. HOW DO  I KEEP A FOOD LOG? After each meal, record the following information in your food log:  What you ate.  How much of it you ate.  How many calories it had.  Then, add up your calories. Keep your food log near you, such as in a small notebook in your pocket. Another option is to use a mobile app or website. Some programs will calculate calories for you and show you how many calories you have left each time you add an item to the log. WHAT ARE SOME CALORIE COUNTING TIPS?  Use your calories on foods and drinks that will fill you up and not leave you hungry. Some examples of this include foods like nuts and nut butters, vegetables, lean proteins, and high-fiber foods (more than 5 g fiber per serving).  Eat nutritious foods and avoid empty calories. Empty calories are calories you get from foods or beverages that do not have many nutrients, such as candy and soda. It is better to have a nutritious high-calorie food (such as an avocado) than a food with few nutrients (such as a bag of chips).  Know how many calories are in the foods you eat most often. This way, you do not have to look up how many calories they have each time you eat them.  Look out for foods that may seem like low-calorie foods but are really high-calorie foods, such as baked goods, soda, and fat-free candy.  Pay attention to calories in drinks. Drinks such as sodas, specialty coffee drinks, alcohol, and juices have a lot of calories yet do not fill you up. Choose low-calorie drinks like water and diet drinks.  Focus your calorie counting efforts on higher calorie items. Logging the calories in a garden salad that contains only vegetables is less important than calculating the calories in a milk shake.  Find a way of tracking calories that works for you. Get creative. Most people who are successful find ways  to keep track of how much they eat in a day, even if they do not count every calorie. WHAT ARE SOME PORTION CONTROL TIPS?  Know how many calories are in a serving. This will help you know how many servings of a certain food you can have.  Use a measuring cup to measure serving sizes. This is helpful when you start out. With time, you will be able to estimate serving sizes for some foods.  Take some time to put servings of different foods on your favorite plates, bowls, and cups so you know what a serving looks like.  Try not to eat straight from a bag or box. Doing this can lead to overeating. Put the amount you would like to eat in a cup or on a plate to make sure you are eating the right portion.  Use smaller plates, glasses, and bowls to prevent overeating. This is a quick and easy way to practice portion control. If your plate is smaller, less food can fit on it.  Try not to multitask while eating, such as watching TV or using your computer. If it is time to eat, sit down at a table and enjoy your food. Doing this will help you to start recognizing when you are full. It will also make you more aware of what and how much you are eating. HOW CAN I CALORIE COUNT WHEN EATING OUT?  Ask for smaller portion sizes or child-sized portions.  Consider sharing an entree and sides instead of getting your own entree.  If you get your own entree, eat only half. Ask for a box at the beginning of your meal and put the rest of your entree in it so you are not tempted to eat it.  Look for the calories on the menu. If calories are listed, choose the lower calorie options.  Choose dishes that include vegetables, fruits, whole grains, low-fat dairy products, and lean protein. Focusing on smart food choices from each of the 5 food groups can help you stay on track at restaurants.  Choose items that are boiled, broiled, grilled, or steamed.  Choose water, milk, unsweetened iced tea, or other drinks without  added sugars. If you want an alcoholic beverage, choose a lower calorie option. For example, a regular margarita can have up to 700 calories and a glass of wine has around 150.  Stay away from items that are buttered, battered, fried, or served with cream sauce. Items labeled "crispy" are usually fried, unless stated otherwise.  Ask for dressings, sauces, and syrups on the side. These are usually very high in calories, so do not eat much of them.  Watch out for salads. Many people think salads are a healthy option, but this is often not the case. Many salads come with bacon, fried chicken, lots of cheese, fried chips, and dressing. All of these items have a lot of calories. If you want a salad, choose a garden salad and ask for grilled meats or steak. Ask for the dressing on the side, or ask for olive oil and vinegar or lemon to use as dressing.  Estimate how many servings of a food you are given. For example, a serving of cooked rice is  cup or about the size of half a tennis ball or one cupcake wrapper. Knowing serving sizes will help you be aware of how much food you are eating at restaurants. The list below tells you how big or small some common portion sizes are based on everyday objects.  1 oz--4 stacked dice.  3 oz--1 deck of cards.  1 tsp--1 dice.  1 Tbsp-- a Ping-Pong ball.  2 Tbsp--1 Ping-Pong ball.   cup--1 tennis ball or 1 cupcake wrapper.  1 cup--1 baseball.   This information is not intended to replace advice given to you by your health care provider. Make sure you discuss any questions you have with your health care provider.   Document Released: 06/09/2005 Document Revised: 06/30/2014 Document Reviewed: 04/14/2013 Elsevier Interactive Patient Education 2016 [place]. Diabetes and Foot Care Diabetes may cause you to have problems because of poor blood supply (circulation) to your feet and legs. This may cause the skin on your feet to become thinner, break easier,  and heal more slowly. Your skin may become dry, and the skin may peel and crack. You may also have nerve damage in your legs and feet causing decreased feeling in them. You may not notice minor injuries to your feet that could lead to infections or more serious problems. Taking care of your feet is one of the most important things you can do for yourself.  HOME CARE INSTRUCTIONS  Wear shoes at all times, even in the house. Do not go barefoot. Bare feet are easily injured.  Check your feet daily for blisters, cuts, and redness. If you cannot see the bottom of your feet, use a mirror or ask someone for help.  Wash your feet with warm water (do not use hot water) and mild soap. Then pat your feet and the  areas between your toes until they are completely dry. Do not soak your feet as this can dry your skin.  Apply a moisturizing lotion or petroleum jelly (that does not contain alcohol and is unscented) to the skin on your feet and to dry, brittle toenails. Do not apply lotion between your toes.  Trim your toenails straight across. Do not dig under them or around the cuticle. File the edges of your nails with an emery board or nail file.  Do not cut corns or calluses or try to remove them with medicine.  Wear clean socks or stockings every day. Make sure they are not too tight. Do not wear knee-high stockings since they may decrease blood flow to your legs.  Wear shoes that fit properly and have enough cushioning. To break in new shoes, wear them for just a few hours a day. This prevents you from injuring your feet. Always look in your shoes before you put them on to be sure there are no objects inside.  Do not cross your legs. This may decrease the blood flow to your feet.  If you find a minor scrape, cut, or break in the skin on your feet, keep it and the skin around it clean and dry. These areas may be cleansed with mild soap and water. Do not cleanse the area with peroxide, alcohol, or  iodine.  When you remove an adhesive bandage, be sure not to damage the skin around it.  If you have a wound, look at it several times a day to make sure it is healing.  Do not use heating pads or hot water bottles. They may burn your skin. If you have lost feeling in your feet or legs, you may not know it is happening until it is too late.  Make sure your health care provider performs a complete foot exam at least annually or more often if you have foot problems. Report any cuts, sores, or bruises to your health care provider immediately. SEEK MEDICAL CARE IF:   You have an injury that is not healing.  You have cuts or breaks in the skin.  You have an ingrown nail.  You notice redness on your legs or feet.  You feel burning or tingling in your legs or feet.  You have pain or cramps in your legs and feet.  Your legs or feet are numb.  Your feet always feel cold. SEEK IMMEDIATE MEDICAL CARE IF:   There is increasing redness, swelling, or pain in or around a wound.  There is a red line that goes up your leg.  Pus is coming from a wound.  You develop a fever or as directed by your health care provider.  You notice a bad smell coming from an ulcer or wound.   This information is not intended to replace advice given to you by your health care provider. Make sure you discuss any questions you have with your health care provider.   Document Released: 06/06/2000 Document Revised: 02/09/2013 Document Reviewed: 11/16/2012 Elsevier Interactive Patient Education [employer].

## 2015-12-17 NOTE — CV Procedure (Signed)
See full note on cath section

## 2015-12-17 NOTE — Progress Notes (Signed)
PROGRESS NOTE    Randall Morton  RUE:[id number]  DOB: 16-Jan-1971  DOA: 12/13/2015 PCP: Pcp Not In System Outpatient Specialists:  Hospital course: Randall Morton is a 45 y.o. 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 Pt reports he began having chest pain this am. Pt reports multiple episodes lasting less than 10 minutes each. Pt went to Urgent care. Pt reports he was given 4 baby aspirin's and pain resolved. Pt reports no current chest pain. Pt denies any recent injury. No heavy lifting. Pt denies any previous chest pain. Pt denies any shortness of breath. Pt denies fever or cough. He denies any recent illness. Pt reports his father had a heart attack. He states his father was told it was because of his diabetes. Pt's mother has had a cardiac cath but no history of heart attack. Pt does not smoke.    Assessment & Plan:   Chest pain. No further CP symptoms reported. Continue. Cardiac monitoring, serial ekg'sno acute findings, ruled out by troponins.   Nuclear medicine stress test shows reversible ischemia.  Cardiology is planning to cath on 6/26.     Diabetes mellitus type 2 in obese  - continue sliding scale coverage as needed, follow CBG.  Blood sugars have been well controlled.   CBG (last 3)   Recent Labs  12/16/15 1140 12/16/15 1704 12/16/15 2122  GLUCAP 143* 130* 121*   A1c still pending at this time.   Obesity, Class III, BMI 40-49.9 (morbid obesity) (HCC)  HTN (hypertension) - blood pressures improved.  - continue blood pressure medications, lisinopril,   Diabetes mellitus, type 2, with neurological complications - blood sugars have been well controlled in hospital.  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  Encouraged not to walk bare foot.    DVT prophylaxis: Heparin Code Status:  Full Family Communication: Pt updated at bedside Disposition Plan: pending cath findings  Consults called:  Admission status: inpatient observation  Procedures:  Nuclear stress test (2 day) 6/23 and 6/24  Cath planned 6/26   Subjective: Pt having no more chest pain. No complaints.  Pt for cath later today.    Objective: Filed Vitals:   12/16/15 0500 12/16/15 1359 12/16/15 2106 12/17/15 0415  BP: 125/90 137/78 130/85 125/83  Pulse: 93 85 81 88  Temp: 98.2 F (36.8 C) 98.1 F (36.7 C) 98 F (36.7 C) 98.1 F (36.7 C)  TempSrc:  Oral Oral Oral  Resp: 19 18 18 18   Height:      Weight: 303 lb 6.4 oz (137.621 kg)   303 lb 12.8 oz (137.803 kg)  SpO2: 96% 100% 100% 100%    Intake/Output Summary (Last 24 hours) at 12/17/15 0735 Last data filed at 12/17/15 0415  Gross per 24 hour  Intake    840 ml  Output   1600 ml  Net   -760 ml   Filed Weights   12/15/15 0500 12/16/15 0500 12/17/15 0415  Weight: 304 lb 12.8 oz (138.256 kg) 303 lb 6.4 oz (137.621 kg) 303 lb 12.8 oz (137.803 kg)    Exam:  General exam: awake, alert, no distress, cooperative Respiratory system: Clear. No increased work of breathing. Cardiovascular system: S1 & S2 heard, RRR. No JVD, murmurs, gallops, clicks or pedal edema. Gastrointestinal system: Abdomen is nondistended, soft and nontender. Normal bowel sounds heard. Central nervous system: Alert and oriented. No  focal neurological deficits. Extremities: trace peripheral edema.  Data Reviewed: Basic Metabolic Panel:  Recent Labs Lab 12/13/15 1218  NA 138  K 4.0  CL 108  CO2 24  GLUCOSE 143*  BUN 10  CREATININE 0.94  CALCIUM 9.0   Liver Function Tests:  Recent Labs Lab 12/13/15 1218  AST 23  ALT 22  ALKPHOS 71  BILITOT 0.5  PROT 7.5  ALBUMIN 3.6   No results for input(s): LIPASE, AMYLASE in the last 168 hours. No results for input(s): AMMONIA in the last 168 hours. CBC:  Recent Labs Lab 12/13/15 1218 12/14/15 0417  WBC 7.3  6.9  NEUTROABS 3.4  --   HGB 14.5 13.4  HCT 41.7 39.5  MCV 85.5 86.2  PLT 244 224   Cardiac Enzymes:  Recent Labs Lab 12/13/15 1644 12/13/15 1919 12/13/15 2251  TROPONINI <0.03 <0.03 <0.03   BNP (last 3 results) No results for input(s): PROBNP in the last 8760 hours. CBG:  Recent Labs Lab 12/15/15 2044 12/16/15 0721 12/16/15 1140 12/16/15 1704 12/16/15 2122  GLUCAP 126* 121* 143* 130* 121*    No results found for this or any previous visit (from the past 240 hour(s)).   Studies: Nm Myocar Multi W/spect W/wall Motion / Ef  12/15/2015  CLINICAL DATA:  Chest pain, diabetes mellitus, hypertension, no cardiac history EXAM: MYOCARDIAL IMAGING WITH SPECT (REST AND PHARMACOLOGIC-STRESS - 2 DAY PROTOCOL) GATED LEFT VENTRICULAR WALL MOTION STUDY LEFT VENTRICULAR EJECTION FRACTION TECHNIQUE: Standard myocardial SPECT imaging was performed after resting intravenous injection of 10 mCi Tc-71m tetrofosmin. Subsequently, on a second day, intravenous infusion of Lexiscan was performed under the supervision of the Cardiology staff. At peak effect of the drug, 30 mCi Tc-39m tetrofosmin was injected intravenously and standard myocardial SPECT imaging was performed. Quantitative gated imaging was also performed to evaluate left ventricular wall motion, and estimate left ventricular ejection fraction. COMPARISON:  None FINDINGS: Perfusion: Decreased myocardial perfusion involving the inferior wall LEFT ventricle slightly greater toward inferolateral wall. Additional subtle decreased perfusion seen at the anteroseptal portion of the LEFT ventricular apex. Both of these sites demonstrate improved perfusion on resting images. Wall Motion: Normal left ventricular wall motion. No left ventricular dilation. Left Ventricular Ejection Fraction: 60 % End diastolic volume 114 ml End systolic volume 45 ml IMPRESSION: 1. Reversible decreased myocardial perfusion involving the inferior wall LEFT ventricle and subtly  at the anteroseptal portion of the LEFT ventricular apex. 2. Normal left ventricular wall motion. 3. Left ventricular ejection fraction 70% 4. Non invasive risk stratification*: Intermediate *2012 Appropriate Use Criteria for Coronary Revascularization Focused Update: J Am Coll Cardiol. 2012;59(9):857-881. [email or web address] Electronically Signed   By: Ulyses Southward M.D.   On: 12/15/2015 12:27   Scheduled Meds: . aspirin  324 mg Oral Daily  . heparin subcutaneous  5,000 Units Subcutaneous Q8H  . insulin aspart  0-20 Units Subcutaneous TID WC  . insulin aspart  0-5 Units Subcutaneous QHS  . lisinopril  20 mg Oral Daily  . sodium chloride flush  3 mL Intravenous Q12H   Continuous Infusions: . sodium chloride 1 mL/kg/hr (12/17/15 0711)   Principal Problem:   Chest pain Active Problems:   Diabetes mellitus type 2 in obese (HCC)   Dysmetabolic syndrome X   Abnormal nuclear stress test   Low HDL (under 40)   HTN (hypertension)   Obesity, Class III, BMI 40-49.9 (morbid obesity) (HCC)  Time spent:   Standley Dakins, MD, FAAFP [facility] Pager [phone]  If 7PM-7AM,  please contact night-coverage [email or web address] Password [email or web address] 12/17/2015, 7:35 AM

## 2015-12-17 NOTE — Discharge Summary (Signed)
Physician Discharge Summary  Randall PrestoCarl S France WUJ:[id number] DOB: [birth year] DOA: 12/13/2015  PCP: Pincus SanesStacy J Burns, MD  Admit date: 12/13/2015 Discharge date: 12/17/2015  Admitted From: Home Disposition:  Home  Recommendations for Outpatient Follow-up:  1. Follow up with PCP in 1-2 weeks for hospital followup   Discharge Condition: Stable CODE STATUS: FULL Diet recommendation: Heart Healthy / Carb Modified  Brief/Interim Summary: HPI: Randall Morton is a 45 y.o. male with medical history significant of hypertension and Diabetes mellitus type II. Pt reports he began having chest pain this am. Pt reports multiple episodes lasting less than 10 minutes each. Pt went to Urgent care. Pt reports he was given 4 baby aspirin's and pain resolved. Pt reports no current chest pain. Pt denies any recent injury. No heavy lifting. Pt denies any previous chest pain. Pt denies any shortness of breath. Pt denies fever or cough. He denies any recent illness. Pt reports his father had a heart attack. He states his father was told it was because of his diabetes. Pt's mother has had a cardiac cath but no history of heart attack. Pt does not smoke   Chest pain. No further CP symptoms reported. Continue. Cardiac monitoring, serial ekg'sno acute findings, ruled out by troponins. Nuclear medicine stress test shows reversible ischemia. Cardiology is planning to cath on 6/26. Cath came back clean, Stress test was false positive.  See report.   Diabetes mellitus type 2 in obese - continue sliding scale coverage as needed, follow CBG. Blood sugars have been well controlled.  HOLD METFORMIN UNTIL NEXT WEEK AS HE HAD IV CONTRAST (CATH)   CBG (last 3)   Recent Labs (last 2 labs)      Recent Labs  12/16/15 1140 12/16/15 1704 12/16/15 2122  GLUCAP 143* 130* 121*     A1c pending.   Obesity, Class III, BMI 40-49.9 (morbid obesity) (HCC) Weight loss management with assistance from primary care  provider recommended.   HTN (hypertension) - blood pressures improved.  - continue blood pressure medications, lisinopril,   Diabetes mellitus, type 2, with neurological complications - blood sugars have been well controlled in hospital.  Follow up with PCP in 2 weeks.   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 Encouraged not to walk bare foot.      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     ibuprofen 800 MG tablet  Commonly known as:  ADVIL,MOTRIN  Take 800 mg by mouth every 8 (eight) hours as needed for mild pain.     L-ARGININE PO  Take 1 tablet by mouth daily.  lisinopril 20 MG tablet  Commonly known as:  PRINIVIL,ZESTRIL  Take 1 tablet (20 mg total) by mouth daily.     metFORMIN 1000 MG tablet  Commonly known as:  GLUCOPHAGE  Take 1 tablet (1,000 mg total) by mouth 2 (two) times daily with a meal.  Start taking on:  12/24/2015     multivitamin with minerals tablet  Take 1 tablet by mouth daily.       Follow-up Information    Follow up with Pincus Sanes, MD. Schedule an appointment as soon as possible for a visit in 2 weeks.   Specialty:  Internal Medicine   Why:  Hospital Follow Up   Contact information:   [address]  [place] [address] [phone]      No Known Allergies  Consultations:  cardiology  Procedures/Studies: Nm Myocar Multi W/spect W/wall Motion / Ef  12/15/2015  CLINICAL DATA:  Chest pain, diabetes mellitus, hypertension, no cardiac history EXAM: MYOCARDIAL IMAGING WITH SPECT (REST AND PHARMACOLOGIC-STRESS - 2 DAY PROTOCOL) GATED LEFT VENTRICULAR WALL MOTION STUDY LEFT VENTRICULAR EJECTION FRACTION TECHNIQUE: Standard myocardial SPECT imaging was performed after resting intravenous injection of 10 mCi Tc-63m tetrofosmin. Subsequently, on a second day, intravenous infusion of Lexiscan was performed under the supervision of the Cardiology staff. At peak effect of the drug, 30 mCi Tc-29m tetrofosmin was injected intravenously and standard myocardial SPECT imaging was performed. Quantitative gated imaging was also performed to evaluate left ventricular wall motion, and estimate left ventricular ejection fraction. COMPARISON:  None FINDINGS: Perfusion: Decreased myocardial perfusion involving the inferior wall LEFT ventricle slightly greater toward inferolateral wall. Additional subtle decreased perfusion seen at the anteroseptal portion of the LEFT ventricular apex. Both of these sites demonstrate improved perfusion on resting images. Wall Motion: Normal left ventricular wall motion. No left ventricular dilation. Left Ventricular Ejection Fraction: 60 % End diastolic volume 114 ml End systolic volume 45 ml IMPRESSION: 1. Reversible decreased myocardial perfusion involving the inferior wall LEFT ventricle and subtly at the anteroseptal portion of the LEFT ventricular apex. 2. Normal left ventricular wall motion. 3. Left ventricular ejection fraction 70% 4. Non invasive risk stratification*: Intermediate *2012 Appropriate Use Criteria for Coronary Revascularization Focused Update: J Am Coll Cardiol. 2012;59(9):857-881. http://content.dementiazones.com.aspx?articleid=[email or web address] Electronically Signed    By: Ulyses Southward M.D.   On: 12/15/2015 12:27   Dg Chest Port 1 View  12/13/2015  CLINICAL DATA:  Chest pain for 1 day. EXAM: PORTABLE CHEST 1 VIEW COMPARISON:  None. FINDINGS: The heart size and mediastinal contours are within normal limits. Both lungs are clear. The visualized skeletal structures are unremarkable. IMPRESSION: No active disease. Electronically Signed   By: Norva Pavlov M.D.   On: 12/13/2015 13:23    Subjective: Pt without complaints.  NO chest pain.   Discharge Exam: Filed Vitals:   12/17/15 0929 12/17/15 0934  BP:    Pulse: 0 0  Temp:    Resp: 29 0   Filed Vitals:   12/17/15 0919 12/17/15 0924 12/17/15 0929 12/17/15 0934  BP: 124/84 132/88    Pulse: 85 84 0 0  Temp:      TempSrc:      Resp: 0  Height:      Weight:      SpO2: 99% 99% 98% 0%    General: Pt is alert, awake, not in acute distress Cardiovascular: RRR, S1/S2 +, no rubs, no gallops Respiratory: CTA bilaterally, no wheezing, no rhonchi Abdominal: Soft, NT, ND, bowel sounds +  Extremities: no edema, no cyanosis Neuro: right charcot foot. Bilateral insensate feet.   The results of significant diagnostics from this hospitalization (including imaging, microbiology, ancillary and laboratory) are listed below for reference.     Microbiology: No results found for this or any previous visit (from the past 240 hour(s)).   Labs: BNP (last 3 results) No results for input(s): BNP in the last 8760 hours. Basic Metabolic Panel:  Recent Labs Lab 12/13/15 1218  NA 138  K 4.0  CL 108  CO2 24  GLUCOSE 143*  BUN 10  CREATININE 0.94  CALCIUM 9.0   Liver Function Tests:  Recent Labs Lab 12/13/15 1218  AST 23  ALT 22  ALKPHOS 71  BILITOT 0.5  PROT 7.5  ALBUMIN 3.6   No results for input(s): LIPASE, AMYLASE in the last 168 hours. No results for input(s): AMMONIA in the last 168 hours. CBC:  Recent Labs Lab 12/13/15 1218 12/14/15 0417  WBC 7.3 6.9  NEUTROABS 3.4  --   HGB  14.5 13.4  HCT 41.7 39.5  MCV 85.5 86.2  PLT 244 224   Cardiac Enzymes:  Recent Labs Lab 12/13/15 1644 12/13/15 1919 12/13/15 2251  TROPONINI <0.03 <0.03 <0.03   BNP: Invalid input(s): POCBNP CBG:  Recent Labs Lab 12/16/15 0721 12/16/15 1140 12/16/15 1704 12/16/15 2122 12/17/15 0729  GLUCAP 121* 143* 130* 121* 185*   D-Dimer No results for input(s): DDIMER in the last 72 hours. Hgb A1c No results for input(s): HGBA1C in the last 72 hours. Lipid Profile No results for input(s): CHOL, HDL, LDLCALC, TRIG, CHOLHDL, LDLDIRECT in the last 72 hours. Thyroid function studies No results for input(s): TSH, T4TOTAL, T3FREE, THYROIDAB in the last 72 hours.  Invalid input(s): FREET3 Anemia work up No results for input(s): VITAMINB12, FOLATE, FERRITIN, TIBC, IRON, RETICCTPCT in the last 72 hours. Urinalysis    Component Value Date/Time   BILIRUBINUR small 08/08/2014 1455   PROTEINUR 30 08/08/2014 1455   UROBILINOGEN 1.0 08/08/2014 1455   NITRITE neg 08/08/2014 1455   LEUKOCYTESUR Negative 08/08/2014 1455   Sepsis Labs Invalid input(s): PROCALCITONIN,  WBC,  LACTICIDVEN Microbiology No results found for this or any previous visit (from the past 240 hour(s)).  Time coordinating discharge: 25 minutes  SIGNED:  Standley Dakinslanford Rosetta Rupnow, MD  [facility] 12/17/2015, 11:16 AM Pager   If 7PM-7AM, please contact night-coverage [email or web address] Password [email or web address]

## 2015-12-17 NOTE — Progress Notes (Signed)
Randall RighterErin  Smith PA-C  Called regarding pt discharge from cardiology. Pt cleared to be discharged.

## 2015-12-17 NOTE — Progress Notes (Signed)
Patient Name: Randall PrestoCarl S Morton Date of Encounter: 12/17/2015  Principal Problem:   Chest pain Active Problems:   Diabetes mellitus type 2 in obese [facility](HCC)   Dysmetabolic syndrome X   Obesity, Class III, BMI 40-49.9 (morbid obesity) (HCC)   HTN (hypertension)   Abnormal nuclear stress test   Low HDL (under 40)   Primary Cardiologist: Dr. Eden EmmsNishan Patient Profile: Mr. Randall PimpleWynn is a 45 year old male with a past medical history of HTN and DM. He has a family history of CAD. Admitted with chest pain, stress test was intermediate risk with inferior wall ischemia, plan for cath today.   SUBJECTIVE: Feels well, denies chest pain and SOB.    OBJECTIVE Filed Vitals:   12/16/15 0500 12/16/15 1359 12/16/15 2106 12/17/15 0415  BP: 125/90 137/78 130/85 125/83  Pulse: 93 85 81 88  Temp: 98.2 F (36.8 C) 98.1 F (36.7 C) 98 F (36.7 C) 98.1 F (36.7 C)  TempSrc:  Oral Oral Oral  Resp: 19 18 18 18   Height:      Weight: 303 lb 6.4 oz (137.621 kg)   303 lb 12.8 oz (137.803 kg)  SpO2: 96% 100% 100% 100%    Intake/Output Summary (Last 24 hours) at 12/17/15 0802 Last data filed at 12/17/15 0415  Gross per 24 hour  Intake    840 ml  Output   1600 ml  Net   -760 ml   Filed Weights   12/15/15 0500 12/16/15 0500 12/17/15 0415  Weight: 304 lb 12.8 oz (138.256 kg) 303 lb 6.4 oz (137.621 kg) 303 lb 12.8 oz (137.803 kg)    PHYSICAL EXAM General: Well developed, well nourished, male in no acute distress. Head: Normocephalic, atraumatic.  Neck: Supple without bruits, no JVD. Lungs:  Resp regular and unlabored, CTA. Heart: RRR, S1, S2, no S3, S4, or murmur; no rub. Abdomen: Soft, non-tender, non-distended, BS + x 4.  Extremities: No clubbing, cyanosis, no edema.  Neuro: Alert and oriented X 3. Moves all extremities spontaneously. Psych: Normal affect.  LABS: INR: Recent Labs  12/17/15 0438  INR 1.04  Fasting Lipid Panel: Recent Labs  12/14/15 0859  CHOL 147  HDL 30*  LDLCALC 88  TRIG  146  CHOLHDL 4.9     Current facility-administered medications:  .  0.9 %  sodium chloride infusion, 250 mL, Intravenous, PRN, Wendall StadePeter C Nishan, MD .  [EXPIRED] 0.9% sodium chloride infusion, 3 mL/kg/hr, Intravenous, Continuous, Last Rate: 412.8 mL/hr at 12/17/15 0605, 3 mL/kg/hr at 12/17/15 0605 **FOLLOWED BY** 0.9% sodium chloride infusion, 1 mL/kg/hr, Intravenous, Continuous, Wendall StadePeter C Nishan, MD, Last Rate: 137.6 mL/hr at 12/17/15 0711, 1 mL/kg/hr at 12/17/15 0711 .  acetaminophen (TYLENOL) tablet 650 mg, 650 mg, Oral, Q4H PRN, Elson AreasLeslie K Sofia, PA-C .  ALPRAZolam Prudy Feeler(XANAX) tablet 0.25 mg, 0.25 mg, Oral, BID PRN, Lonia SkinnerLeslie K Sofia, PA-C .  aspirin chewable tablet 324 mg, 324 mg, Oral, Daily, Ozella Rocksavid J Merrell, MD, 324 mg at 12/16/15 0845 .  gi cocktail (Maalox,Lidocaine,Donnatal), 30 mL, Oral, QID PRN, Elson AreasLeslie K Sofia, PA-C .  heparin injection 5,000 Units, 5,000 Units, Subcutaneous, Q8H, Ozella Rocksavid J Merrell, MD, 5,000 Units at 12/16/15 0541 .  insulin aspart (novoLOG) injection 0-20 Units, 0-20 Units, Subcutaneous, TID WC, Elson AreasLeslie K Sofia, PA-C, 3 Units at 12/16/15 1735 .  insulin aspart (novoLOG) injection 0-5 Units, 0-5 Units, Subcutaneous, QHS, Lonia SkinnerLeslie K MorrowSofia, PA-C, 0 Units at 12/13/15 2134 .  lisinopril (PRINIVIL,ZESTRIL) tablet 20 mg, 20 mg, Oral, Daily, Clanford  Cyndie MullL Johnson, MD, 20 mg at 12/16/15 0845 .  morphine 2 MG/ML injection 1 mg, 1 mg, Intravenous, Q4H PRN, Clanford Cyndie MullL Johnson, MD .  nitroGLYCERIN (NITROSTAT) SL tablet 0.4 mg, 0.4 mg, Sublingual, Q5 min PRN, Lonia SkinnerLeslie K Sofia, PA-C .  ondansetron [facility](ZOFRAN) injection 4 mg, 4 mg, Intravenous, Q6H PRN, Elson AreasLeslie K Sofia, PA-C .  sodium chloride flush (NS) 0.9 % injection 3 mL, 3 mL, Intravenous, Q12H, Wendall StadePeter C Nishan, MD .  sodium chloride flush (NS) 0.9 % injection 3 mL, 3 mL, Intravenous, PRN, Wendall StadePeter C Nishan, MD .  zolpidem (AMBIEN) tablet 5 mg, 5 mg, Oral, QHS PRN,MR X 1, Lonia SkinnerLeslie K Sofia, PA-C . sodium chloride 1 mL/kg/hr (12/17/15 0711)      TELE:   NSR  ECG: NSR  Radiology/Studies: Nm Myocar Multi W/spect W/wall Motion / Ef  12/15/2015  CLINICAL DATA:  Chest pain, diabetes mellitus, hypertension, no cardiac history EXAM: MYOCARDIAL IMAGING WITH SPECT (REST AND PHARMACOLOGIC-STRESS - 2 DAY PROTOCOL) GATED LEFT VENTRICULAR WALL MOTION STUDY LEFT VENTRICULAR EJECTION FRACTION TECHNIQUE: Standard myocardial SPECT imaging was performed after resting intravenous injection of 10 mCi Tc-7715m tetrofosmin. Subsequently, on a second day, intravenous infusion of Lexiscan was performed under the supervision of the Cardiology staff. At peak effect of the drug, 30 mCi Tc-2315m tetrofosmin was injected intravenously and standard myocardial SPECT imaging was performed. Quantitative gated imaging was also performed to evaluate left ventricular wall motion, and estimate left ventricular ejection fraction. COMPARISON:  None FINDINGS: Perfusion: Decreased myocardial perfusion involving the inferior wall LEFT ventricle slightly greater toward inferolateral wall. Additional subtle decreased perfusion seen at the anteroseptal portion of the LEFT ventricular apex. Both of these sites demonstrate improved perfusion on resting images. Wall Motion: Normal left ventricular wall motion. No left ventricular dilation. Left Ventricular Ejection Fraction: 60 % End diastolic volume 114 ml End systolic volume 45 ml IMPRESSION: 1. Reversible decreased myocardial perfusion involving the inferior wall LEFT ventricle and subtly at the anteroseptal portion of the LEFT ventricular apex. 2. Normal left ventricular wall motion. 3. Left ventricular ejection fraction 70% 4. Non invasive risk stratification*: Intermediate *2012 Appropriate Use Criteria for Coronary Revascularization Focused Update: J Am Coll Cardiol. 2012;59(9):857-881. http://content.dementiazones.comonlinejacc.org/article.aspx?articleid=1201161 Electronically Signed   By: Ulyses SouthwardMark  Boles M.D.   On: 12/15/2015 12:27     Current Medications:  . aspirin   324 mg Oral Daily  . heparin subcutaneous  5,000 Units Subcutaneous Q8H  . insulin aspart  0-20 Units Subcutaneous TID WC  . insulin aspart  0-5 Units Subcutaneous QHS  . lisinopril  20 mg Oral Daily  . sodium chloride flush  3 mL Intravenous Q12H   . sodium chloride 1 mL/kg/hr (12/17/15 0711)    ASSESSMENT AND PLAN: Principal Problem:   Chest pain Active Problems:   Diabetes mellitus type 2 in obese (HCC)   Dysmetabolic syndrome X   Obesity, Class III, BMI 40-49.9 (morbid obesity) (HCC)   HTN (hypertension)   Abnormal nuclear stress test   Low HDL (under 40)  1. Chest pain: Admitted with chest pressure, Lexiscan Myoview showed possible inferior wall ischemia. He will have left heart cath today. Troponin negative x 3. EKG with no acute ST changes. He is on full dose ASA.   2. HTN: BP well controlled with ACE. Renal function is stable.   3. HLD: LDL is 88, will need statin if found to have CAD.   4. DM: A1C pending. On sliding scale.   5. Obesity: Needs aggressive weight loss management.  Discussed diet modifications.    Signed, Little Ishikawa , NP 8:02 AM 12/17/2015 Pager [phone]

## 2015-12-27 ENCOUNTER — Ambulatory Visit (INDEPENDENT_AMBULATORY_CARE_PROVIDER_SITE_OTHER): Payer: 59 | Admitting: Podiatry

## 2015-12-27 ENCOUNTER — Encounter: Payer: Self-pay | Admitting: Podiatry

## 2015-12-27 VITALS — BP 132/88 | HR 69 | Resp 12

## 2015-12-27 DIAGNOSIS — S91119A Laceration without foreign body of unspecified toe without damage to nail, initial encounter: Secondary | ICD-10-CM

## 2015-12-27 DIAGNOSIS — B351 Tinea unguium: Secondary | ICD-10-CM | POA: Diagnosis not present

## 2015-12-27 DIAGNOSIS — M79676 Pain in unspecified toe(s): Secondary | ICD-10-CM

## 2015-12-27 DIAGNOSIS — E1142 Type 2 diabetes mellitus with diabetic polyneuropathy: Secondary | ICD-10-CM

## 2015-12-27 DIAGNOSIS — M1460 Charcot's joint, unspecified site: Secondary | ICD-10-CM

## 2015-12-27 NOTE — Progress Notes (Signed)
He presents today to pick up his orthotics and for a chief complaint of a painful elongated toenails. He is also following up for his Charcot deformity right foot for which she wears his cam walker in today.  Objective: Vital signs are stable alert and oriented 3. Pulses are palpable. Mild edema to the right foot much decreased from previous evaluation. The foot is not warm to the touch any longer other than normal temperature. Pulses remain palpable. No open lesions or wounds. Laceration has healed well. Charcot arthropathy is still present and appears to be healing in. His toenails are thick yellow dystrophic onychomycotic.  Assessment: Pain limp secondary to diabetic peripheral neuropathy Charcot arthropathy and painful elongated toenails.  Plan: Debridement of toenails 1 through 5 bilateral. We dispensed the orthotics with both oral and written home-going instructions for care of the orthotics and the use. We will follow-up with him in 1 month at which time another set of x-rays will be performed of the right foot looking for consolidation.

## 2016-01-23 ENCOUNTER — Other Ambulatory Visit: Payer: Self-pay

## 2016-01-23 MED ORDER — METFORMIN HCL 1000 MG PO TABS
1000.0000 mg | ORAL_TABLET | Freq: Two times a day (BID) | ORAL | 0 refills | Status: DC
Start: 2016-01-23 — End: 2016-02-05

## 2016-01-23 MED ORDER — GLYBURIDE 5 MG PO TABS: 5.0000 mg | ORAL_TABLET | Freq: Two times a day (BID) | ORAL | 0 refills | Status: DC

## 2016-01-24 ENCOUNTER — Ambulatory Visit (INDEPENDENT_AMBULATORY_CARE_PROVIDER_SITE_OTHER): Payer: 59 | Admitting: Podiatry

## 2016-01-24 ENCOUNTER — Ambulatory Visit (INDEPENDENT_AMBULATORY_CARE_PROVIDER_SITE_OTHER): Payer: 59

## 2016-01-24 DIAGNOSIS — M76821 Posterior tibial tendinitis, right leg: Secondary | ICD-10-CM | POA: Diagnosis not present

## 2016-01-24 DIAGNOSIS — M79671 Pain in right foot: Secondary | ICD-10-CM

## 2016-01-24 MED ORDER — OXYCODONE-ACETAMINOPHEN 10-325 MG PO TABS: 1.0000 | ORAL_TABLET | Freq: Three times a day (TID) | ORAL | 0 refills | Status: DC | PRN

## 2016-01-25 NOTE — Progress Notes (Signed)
He presents today for follow-up his Charcot deformity of his right foot and states that he has been wearing his orthotics which feel very comfortable. He has noticed severe pain along the medial ankle over the past few days but does not relate any trauma. He states that it feels like someone is stabbing a screwdriver through his foot from the medial side.  Objective: Vital signs are stable he is alert and oriented 3. The right foot is mildly swollen more swollen than last time I saw it. He has tenderness on palpation posterior tibial tendon as it courses to the navicular tuberosity. Radiographs demonstrate consolidating Charcot deformity midfoot right.  Assessment: Charcot arthropathy with no open lesions or wounds. Moderate to severe posterior tibial tendinitis and possible tear.  Plan: Request an MRI of the midfoot. I placed him in a Cam Walker and request that he remain in this until I follow up with him once the MRI is complete.

## 2016-02-02 ENCOUNTER — Ambulatory Visit
Admission: RE | Admit: 2016-02-02 | Discharge: 2016-02-02 | Disposition: A | Discharge status: Home / Self Care | Payer: 59 | Source: Ambulatory Visit | Attending: Podiatry | Admitting: Podiatry

## 2016-02-02 DIAGNOSIS — M76821 Posterior tibial tendinitis, right leg: Secondary | ICD-10-CM

## 2016-02-05 ENCOUNTER — Ambulatory Visit (INDEPENDENT_AMBULATORY_CARE_PROVIDER_SITE_OTHER): Payer: 59 | Admitting: Internal Medicine

## 2016-02-05 ENCOUNTER — Encounter: Payer: Self-pay | Admitting: Internal Medicine

## 2016-02-05 ENCOUNTER — Other Ambulatory Visit: Payer: 59

## 2016-02-05 ENCOUNTER — Other Ambulatory Visit (INDEPENDENT_AMBULATORY_CARE_PROVIDER_SITE_OTHER): Payer: 59

## 2016-02-05 VITALS — BP 122/84 | HR 94 | Temp 98.1°F | Resp 16 | Ht 74.0 in | Wt 303.0 lb

## 2016-02-05 DIAGNOSIS — E1340 Other specified diabetes mellitus with diabetic neuropathy, unspecified: Secondary | ICD-10-CM

## 2016-02-05 DIAGNOSIS — E1142 Type 2 diabetes mellitus with diabetic polyneuropathy: Secondary | ICD-10-CM

## 2016-02-05 DIAGNOSIS — E1165 Type 2 diabetes mellitus with hyperglycemia: Secondary | ICD-10-CM

## 2016-02-05 DIAGNOSIS — I10 Essential (primary) hypertension: Secondary | ICD-10-CM

## 2016-02-05 DIAGNOSIS — IMO0002 Reserved for concepts with insufficient information to code with codable children: Secondary | ICD-10-CM

## 2016-02-05 DIAGNOSIS — E669 Obesity, unspecified: Secondary | ICD-10-CM | POA: Diagnosis not present

## 2016-02-05 DIAGNOSIS — E250 Congenital adrenogenital disorders associated with enzyme deficiency: Secondary | ICD-10-CM

## 2016-02-05 DIAGNOSIS — E11610 Type 2 diabetes mellitus with diabetic neuropathic arthropathy: Secondary | ICD-10-CM

## 2016-02-05 DIAGNOSIS — E259 Adrenogenital disorder, unspecified: Secondary | ICD-10-CM | POA: Diagnosis not present

## 2016-02-05 LAB — COMPREHENSIVE METABOLIC PANEL
ALBUMIN: 4.2 g/dL (ref 3.5–5.2)
ALK PHOS: 77 U/L (ref 39–117)
ALT: 29 U/L (ref 0–53)
AST: 26 U/L (ref 0–37)
BILIRUBIN TOTAL: 0.6 mg/dL (ref 0.2–1.2)
BUN: 13 mg/dL (ref 6–23)
CO2: 27 mEq/L (ref 19–32)
Calcium: 9.6 mg/dL (ref 8.4–10.5)
Chloride: 103 mEq/L (ref 96–112)
Creatinine, Ser: 0.89 mg/dL (ref 0.40–1.50)
GFR: 117.62 mL/min (ref >60.00)
GLUCOSE: 166 mg/dL — AB (ref 70–99)
POTASSIUM: 4 meq/L (ref 3.5–5.1)
Sodium: 138 mEq/L (ref 135–145)
TOTAL PROTEIN: 8.1 g/dL (ref 6.0–8.3)

## 2016-02-05 LAB — TSH: TSH: 1.1 u[IU]/mL (ref 0.35–4.50)

## 2016-02-05 LAB — HEMOGLOBIN A1C: HEMOGLOBIN A1C: 7.9 % — AB (ref 4.6–6.5)

## 2016-02-05 MED ORDER — BLOOD GLUCOSE MONITOR KIT: PACK | 0 refills | Status: AC

## 2016-02-05 MED ORDER — LISINOPRIL 10 MG PO TABS
10.0000 mg | ORAL_TABLET | Freq: Every day | ORAL | 5 refills | Status: DC
Start: 2016-02-05 — End: 2017-01-05

## 2016-02-05 MED ORDER — GLYBURIDE 5 MG PO TABS: ORAL_TABLET | ORAL | 5 refills | Status: DC

## 2016-02-05 MED ORDER — FUROSEMIDE 20 MG PO TABS
20.0000 mg | ORAL_TABLET | Freq: Every day | ORAL | 5 refills | Status: DC
Start: 2016-02-05 — End: 2017-01-05

## 2016-02-05 MED ORDER — METFORMIN HCL 500 MG PO TABS: 500.0000 mg | ORAL_TABLET | Freq: Two times a day (BID) | ORAL | 5 refills | Status: DC

## 2016-02-05 NOTE — Assessment & Plan Note (Signed)
Following with podiatry.

## 2016-02-05 NOTE — Patient Instructions (Signed)
  Test(s) ordered today. Your results will be released to MyChart (or called to you) after review, usually within 72hours after test completion. If any changes need to be made, you will be notified at that same time.  All other Health Maintenance issues reviewed.   All recommended immunizations and age-appropriate screenings are up-to-date or discussed.  No immunizations administered today.   Medications reviewed and updated.  Changes include decreasing your metformin to 500 mg twice daily, increasing your glyburide to two pills in the morning and one pill in the evening.  We will also decrease the lisinopril to 10 mg daily.    Your prescription(s) have been submitted to your pharmacy. Please take as directed and contact our office if you believe you are having problem(s) with the medication(s).   Please followup in 3 months

## 2016-02-05 NOTE — Assessment & Plan Note (Signed)
Check A1c We will decrease metformin to 500 mg twice daily due to diarrhea-if this still causes diarrhea will discontinue Continue glyburide, but increase to 2 pills in the morning and 1 pill at night since we are decreasing the metformin Glucometer prescription given-encouraged him to monitor his sugars at home Continue regular exercise, diabetic diet and weight loss efforts Follow-up in 3 months

## 2016-02-05 NOTE — Assessment & Plan Note (Addendum)
Has not taken lisinopril for two weeks BP looks pretty good today He does not check it at home Has lost significant weight - 22 lbs Has had some lightheadedness at times Will decrease lisinopril to 10 mg daily Encouraged him to monitor his blood pressure

## 2016-02-05 NOTE — Telephone Encounter (Signed)
He needs PT or will run risk of rupture.  Needs PT no matter what.

## 2016-02-05 NOTE — Assessment & Plan Note (Signed)
Continue regular exercise, diabetic diet and decreased portions Has lost 22 pounds in the last 9-12 months and will continue his efforts

## 2016-02-05 NOTE — Progress Notes (Signed)
Subjective:    Patient ID: Randall Morton, male    DOB: July 15, 1970, 45 y.o.   MRN: [id number]  HPI He is here to establish with a new pcp.     Obesity:  He started to be very active with his weight loss 9-12 months ago.  He was 325 lbs when he started and is now 303 lbs.  He has been lifting weights. He has decreased his carb intake.  He stopped drinking sweet tea.  He stopped sugar in his coffee.  He eats wheat/brown rice or noodles when he eats them.  He has cut down on sweets.    Diabetes, neuropathy, charcot right ankle: He is taking his medication daily as prescribed. The metformin causes diarrhea. He is compliant with a diabetic diet. He is exercising regularly. He does not check his sugars.  He checks his feet daily and denies foot lesions. He is up-to-date with an ophthalmology examination.   Hypertension: He is not taking his medication daily - he ran out a couple of weeks ago. He is compliant with a low sodium diet.  He denies chest pain, palpitations, edema, shortness of breath and regular headaches. He is exercising regularly.  He does not monitor his blood pressure at home.    He has some muscle weakness and wonders if his testosterone level is low.  He has tried taking a natural testosterone supplement, but he does not feel this has helped.  Scalp infection:  He has a periodic scalp infection.  It bleeds at times.  He has been on antibiotics in the past for something else and it has helped.  He does not have any current symptoms.  Medications and allergies reviewed with patient and updated if appropriate.  Patient Active Problem List   Diagnosis Date Noted  . False positive cardiac stress test 12/17/2015  . 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 Diabetic Charct's arthropathy (HCC)   . Abnormal nuclear stress test 12/15/2015  . Low HDL (under 40) 12/15/2015  . Chest pain 12/13/2015  . Toe osteomyelitis, left (HCC) 07/11/2014  .  Diabetes mellitus type II, uncontrolled (HCC) 08/26/2013  . Neuropathy due to secondary diabetes (HCC) 08/26/2013  . HTN (hypertension) 08/26/2013  . Obesity, Class III, BMI 40-49.9 (morbid obesity) (HCC) 09/17/2012  . Dysmetabolic syndrome X 09/08/2007  . HYPERTENSION 02/24/2007  . Diabetes mellitus type 2 in obese (HCC) 11/06/2006    Current Outpatient Prescriptions on File Prior to Visit  Medication Sig Dispense Refill  . glyBURIDE (DIABETA) 5 MG tablet Take 1 tablet (5 mg total) by mouth 2 (two) times daily with a meal. 60 tablet 0  . ibuprofen (ADVIL,MOTRIN) 800 MG tablet Take 800 mg by mouth every 8 (eight) hours as needed for mild pain.    . L-ARGININE PO Take 1 tablet by mouth daily.    [name]. lisinopril (PRINIVIL,ZESTRIL) 20 MG tablet Take 1 tablet (20 mg total) by mouth daily. 30 tablet 0  . metFORMIN (GLUCOPHAGE) 1000 MG tablet Take 1 tablet (1,000 mg total) by mouth 2 (two) times daily with a meal. 60 tablet 0  . Multiple Vitamins-Minerals (MULTIVITAMIN WITH MINERALS) tablet Take 1 tablet by mouth daily.    [name]. oxyCODONE-acetaminophen (PERCOCET) 10-325 MG tablet Take 1 tablet by mouth every 8 (eight) hours as needed for pain. 30 tablet 0   No current facility-administered medications on file prior to visit.     Past Medical History:  Diagnosis Date  .  Diabetes mellitus without complication (HCC)   . Diabetic Charct's arthropathy (HCC)    right foot  . Diabetic retinopathy (HCC)    last checked on 04/2014  . Hypertension   . Osteomyelitis (HCC)    left foot ulcer--> osteo  . Polyneuropathy in diabetes [facility](HCC)     Past Surgical History:  Procedure Laterality Date  . CARDIAC CATHETERIZATION N/A 12/17/2015   Procedure: Left Heart Cath and Coronary Angiography;  Surgeon: Wendall StadePeter C Nishan, MD;  [facility];  Service: Cardiovascular;  Laterality: N/A;    Social History   Social History  . Marital status: Single    Spouse name: N/A  . Number of children: N/A    . Years of education: N/A   Social History Main Topics  . Smoking status: Never Smoker  . Smokeless tobacco: Not on file  . Alcohol use 0.0 oz/week  . Drug use: No  . Sexual activity: Not on file   Other Topics Concern  . Not on file   Social History Narrative  . No narrative on file    Family History  Problem Relation Age of Onset  . Asthma Mother   . Diabetes Father   . Heart disease Father   . Hyperlipidemia Father   . Hypertension Father   . Stroke Father   . Prostate cancer Paternal Grandfather     Review of Systems  Constitutional: Negative for chills and fever.  Eyes: Negative for visual disturbance.  Cardiovascular: Positive for leg swelling (R > L). Negative for chest pain and palpitations.  Gastrointestinal: Positive for diarrhea (with metformin). Negative for abdominal pain.  Musculoskeletal: Positive for arthralgias (ankle pain).  Neurological: Positive for light-headedness (on occasion) and headaches.  Psychiatric/Behavioral: Negative for dysphoric mood. The patient is nervous/anxious (mild).        Objective:   Vitals:   02/05/16 1521  BP: 122/84  Pulse: 94  Resp: 16  Temp: 98.1 F (36.7 C)   Filed Weights   02/05/16 1521  Weight: (!) 303 lb (137.4 kg)   Body mass index is 38.9 kg/m.   Physical Exam Constitutional: He appears well-developed and well-nourished. No distress.  HENT:  Head: Normocephalic and atraumatic.  Right Ear: External ear normal.  Left Ear: External ear normal.  Mouth/Throat: Oropharynx is clear and moist.  Normal ear canals and TM b/l  Eyes: Conjunctivae and EOM are normal.  Neck: Neck supple. No tracheal deviation present. No thyromegaly present.  No carotid bruit  Cardiovascular: Normal rate, regular rhythm, normal heart sounds and intact distal pulses.   No murmur heard. Pulmonary/Chest: Effort normal and breath sounds normal. No respiratory distress. He has no wheezes. He has no rales.  Abdominal: Soft. He  exhibits no distension. There is no tenderness.  Musculoskeletal: He exhibits mild edema.  Lymphadenopathy:    He has no cervical adenopathy.  Skin: Skin is warm and dry. He is not diaphoretic.  Psychiatric: He has a normal mood and affect. His behavior is normal.       Assessment & Plan:     See Problem List for Assessment and Plan of chronic medical problems.

## 2016-02-05 NOTE — Assessment & Plan Note (Signed)
Has intermittent numbness/tingling in his hands/feet Does not feel he needs gabapentin at this time Will monitor Follows with podiatry - Dr Al CorpusHyatt

## 2016-02-05 NOTE — Progress Notes (Signed)
Pre visit review using our clinic review tool, if applicable. No additional management support is needed unless otherwise documented below in the visit note. 

## 2016-02-06 ENCOUNTER — Telehealth: Payer: Self-pay | Admitting: *Deleted

## 2016-02-06 NOTE — Telephone Encounter (Signed)
Entered in error

## 2016-02-07 LAB — TESTOSTERONE, FREE, TOTAL, SHBG
Sex Hormone Binding: 58.6 nmol/L — ABNORMAL HIGH (ref 16.5–55.9)
TESTOSTERONE: 409 ng/dL (ref 264–916)
Testosterone, Free: 7.8 pg/mL (ref 6.8–21.5)

## 2016-02-11 ENCOUNTER — Ambulatory Visit: Payer: 59 | Attending: Podiatry

## 2016-02-11 ENCOUNTER — Telehealth: Payer: Self-pay | Admitting: Internal Medicine

## 2016-02-11 DIAGNOSIS — M25652 Stiffness of left hip, not elsewhere classified: Secondary | ICD-10-CM | POA: Insufficient documentation

## 2016-02-11 DIAGNOSIS — M25674 Stiffness of right foot, not elsewhere classified: Secondary | ICD-10-CM | POA: Insufficient documentation

## 2016-02-11 DIAGNOSIS — M25675 Stiffness of left foot, not elsewhere classified: Secondary | ICD-10-CM | POA: Insufficient documentation

## 2016-02-11 DIAGNOSIS — M79671 Pain in right foot: Secondary | ICD-10-CM | POA: Insufficient documentation

## 2016-02-11 DIAGNOSIS — R262 Difficulty in walking, not elsewhere classified: Secondary | ICD-10-CM | POA: Diagnosis present

## 2016-02-11 DIAGNOSIS — M25651 Stiffness of right hip, not elsewhere classified: Secondary | ICD-10-CM | POA: Diagnosis present

## 2016-02-11 DIAGNOSIS — R293 Abnormal posture: Secondary | ICD-10-CM

## 2016-02-11 NOTE — Telephone Encounter (Signed)
Called [employer] [phone] Spoke with Tresa EndoKelly Pt eligibility started 01/22/16 Id# [id number] S

## 2016-02-11 NOTE — Patient Instructions (Signed)
Use of ice pack or frozen water bottle  for pain after work. Gentle hip IR stretching in water with water exercise

## 2016-02-11 NOTE — Therapy (Signed)
[facility] [address] [place], [place], [address] Phone: [phone]   Fax:  [phone]  Physical Therapy Evaluation  Patient Details  Name: Randall PrestoCarl S Morton MRN: [id number] Date of Birth: [birth year] Referring Provider: Ernestene KielMax Hyatt, DPM  Encounter Date: 02/11/2016      PT End of Session - 02/11/16 0728    Visit Number 1   Number of Visits 12   Date for PT Re-Evaluation 03/24/16   Authorization Type [employer]   PT Start Time 0714  late 12 min   PT Stop Time 0750   PT Time Calculation (min) 36 min   Activity Tolerance Patient tolerated treatment well   Behavior During Therapy [facility] for tasks assessed/performed      Past Medical History:  Diagnosis Date  . Diabetes mellitus without complication (HCC)   . Diabetic Charct's arthropathy (HCC)    right foot  . Diabetic retinopathy (HCC)    last checked on 04/2014  . Hypertension   . Osteomyelitis (HCC)    left foot ulcer--> osteo  . Polyneuropathy in diabetes [facility](HCC)     Past Surgical History:  Procedure Laterality Date  . CARDIAC CATHETERIZATION N/A 12/17/2015   Procedure: Left Heart Cath and Coronary Angiography;  Surgeon: Wendall StadePeter C Nishan, MD;  [facility];  Service: Cardiovascular;  Laterality: N/A;    There were no vitals filed for this visit.       Subjective Assessment - 02/11/16 0719    Subjective PAin started 3 weeks ago. No injury.   He began to notice pain walking from [place] to get more suppies then felt like screwdriver into ankle.[name]. He uses medication and elevates when at home. Weekends does aquatics.  .    Pertinent History peripheral neuropathy with charcot foot. He has supports.    Limitations Walking  no limits but painful . Not able to run.    Diagnostic tests MRI: No tear but stressed tendong   Patient Stated Goals Gain improved balance barefoot, strengthen tendon.    Currently in Pain? No/denies  with inserts            [facility]  Assessment - 02/11/16 0717      Assessment   Medical Diagnosis RT posterior tibial tendonitis   Referring Provider Ernestene KielMax Hyatt, DPM   Prior Therapy No     Precautions   Precautions None;Other (comment)   Precaution Comments Limit walking barefoot,      Restrictions   Weight Bearing Restrictions No     Balance Screen   Has the patient fallen in the past 6 months No   Has the patient had a decrease in activity level because of a fear of falling?  No   Is the patient reluctant to leave their home because of a fear of falling?  No     Prior Function   Level of Independence Independent     Cognition   Overall Cognitive Status Within Functional Limits for tasks assessed     Observation/Other Assessments-Edema    Edema Figure 8     Figure 8 Edema   Figure 8 - Right  68 cm   Figure 8 - Left  63 cm     Posture/Postural Control   Posture Comments pronation with pes planus RT and both legs RT > LT ER with walking     ROM / Strength   AROM / PROM / Strength AROM;Strength;PROM     AROM   AROM Assessment Site Ankle  Right/Left Ankle Right;Left   Right Ankle Dorsiflexion 90   Right Ankle Plantar Flexion 34   Right Ankle Inversion 2   Right Ankle Eversion 35   Left Ankle Dorsiflexion 90   Left Ankle Plantar Flexion 44   Left Ankle Inversion 35   Left Ankle Eversion 23     PROM   Overall PROM Comments Hip Internal  rotation decr LT to neutral and RT -20 degrees with LE extended to neutral extension ,  and with hip flexed to 90 degress with ER to 45 degrees  grreat toe DF 60 degrees   PROM Assessment Site --   Right/Left Ankle Right;Left   Right Ankle Dorsiflexion 100   Left Ankle Dorsiflexion 95   Left Ankle Inversion 5   Left Ankle Eversion 35     Strength   Strength Assessment Site Ankle   Right/Left Ankle Right;Left   Right Ankle Dorsiflexion 5/5   Right Ankle Inversion 5/5   Right Ankle Eversion 5/5   Left Ankle Dorsiflexion 5/5   Left Ankle Inversion 5/5   Left  Ankle Eversion 5/5     Ambulation/Gait   Assistive device None   Ambulation Surface Level;Indoor   Gait Comments LE RT > LT ER with decr flexion   and decr weight to Rt foot.                            PT Education - 02/11/16 0728    Education provided Yes   Education Details POC,  use of ice to painful area   Person(s) Educated Patient   Methods Explanation   Comprehension Verbalized understanding          PT Short Term Goals - 02/11/16 0759      PT SHORT TERM GOAL #1   Title He will be independent with inital HEP   Time 3   Period Weeks   Status New     PT SHORT TERM GOAL #2   Title He will reports  pain decrease 30 % with wlaking at end of work day   Time 3   Period Weeks   Status New           PT Long Term Goals - 02/11/16 0800      PT LONG TERM GOAL #1   Title He will be independent in all HEP issued   Time 6   Period Weeks   Status New     PT LONG TERM GOAL #2   Title He reports pain eased 75% with walking and at end of work day   Time 6   Period Weeks   Status New     PT LONG TERM GOAL #3   Title Edema decr 3 CM   Time 6   Period Weeks   Status New     PT LONG TERM GOAL #4   Title Hip IR RT -10 degrees to help decr foot pain with walking and standing   Time 6   Period Weeks   Status New               Plan - 02/11/16 0803    Clinical Impression Statement [name] presesnts for moderate complexity eval due to comorbidities of peripheral neuropathy and Charcot foot. He demo limp with walking , stiffness of both ankles, stifness of RT > Lt hip and stiffness of RT hip may contribute to RT foot pain.   Rehab Potential Good  PT Frequency 2x / week   PT Duration 6 weeks   PT Treatment/Interventions Cryotherapy;Iontophoresis 4mg /ml Dexamethasone;Ultrasound;Passive range of motion;Patient/family education;Manual lymph drainage;Manual techniques;Taping;Balance training;Therapeutic exercise   PT Next Visit Plan Initiate HEP  , modalites, manual, BERG balance test   PT Home Exercise Plan ice, Hip IR stretch in water   Consulted and Agree with Plan of Care Patient      Patient will benefit from skilled therapeutic intervention in order to improve the following deficits and impairments:  Abnormal gait, Decreased range of motion, Difficulty walking, Pain, Postural dysfunction, Decreased activity tolerance, Decreased balance  Visit Diagnosis: Pain in right foot - Plan: PT plan of care cert/re-cert  Difficulty in walking, not elsewhere classified - Plan: PT plan of care cert/re-cert  Abnormal posture - Plan: PT plan of care cert/re-cert  Stiffness of right hip, not elsewhere classified - Plan: PT plan of care cert/re-cert  Stiffness of right foot, not elsewhere classified - Plan: PT plan of care cert/re-cert  Stiffness of left foot, not elsewhere classified - Plan: PT plan of care cert/re-cert  Stiffness of left hip, not elsewhere classified - Plan: PT plan of care cert/re-cert     Problem List Patient Active Problem List   Diagnosis Date Noted  . Obese 02/05/2016  . Diabetic Charct's arthropathy (HCC)   . Low HDL (under 40) 12/15/2015  . Chest pain 12/13/2015  . Toe osteomyelitis, left (HCC) 07/11/2014  . Diabetes mellitus type II, uncontrolled (HCC) 08/26/2013  . Neuropathy due to secondary diabetes (HCC) 08/26/2013  . HTN (hypertension) 08/26/2013  . Dysmetabolic syndrome X 09/08/2007    Caprice Red  PT 02/11/2016, 8:20 AM  [facility] [address] [place], [place], [address] Phone: [phone]   Fax:  [phone]  Name: UBALDO DAYWALT MRN: [id number] Date of Birth: 1970-12-23

## 2016-02-13 ENCOUNTER — Ambulatory Visit: Payer: 59

## 2016-02-13 DIAGNOSIS — R262 Difficulty in walking, not elsewhere classified: Secondary | ICD-10-CM

## 2016-02-13 DIAGNOSIS — M79671 Pain in right foot: Secondary | ICD-10-CM | POA: Diagnosis not present

## 2016-02-13 DIAGNOSIS — M25651 Stiffness of right hip, not elsewhere classified: Secondary | ICD-10-CM

## 2016-02-13 DIAGNOSIS — M25674 Stiffness of right foot, not elsewhere classified: Secondary | ICD-10-CM

## 2016-02-13 NOTE — Therapy (Signed)
[facility] [address] [place], [place], [address] Phone: [phone]   Fax:  [phone]  Physical Therapy Treatment  Patient Details  Name: Randall PrestoCarl Morton Stines MRN: [id number] Date of Birth: Apr 15, 1971 Referring Provider: Ernestene KielMax Hyatt, DPM  Encounter Date: 02/13/2016      PT End of Session - 02/13/16 0817    Visit Number 2   Number of Visits 12   Date for PT Re-Evaluation 03/24/16   Authorization Type [employer]   PT Start Time 0815   PT Stop Time 0845   PT Time Calculation (min) 30 min   Activity Tolerance Patient tolerated treatment well   Behavior During Therapy [facility] for tasks assessed/performed      Past Medical History:  Diagnosis Date  . Diabetes mellitus without complication (HCC)   . Diabetic Charct'Morton arthropathy (HCC)    right foot  . Diabetic retinopathy (HCC)    last checked on 04/2014  . Hypertension   . Osteomyelitis (HCC)    left foot ulcer--> osteo  . Polyneuropathy in diabetes [facility](HCC)     Past Surgical History:  Procedure Laterality Date  . CARDIAC CATHETERIZATION N/A 12/17/2015   Procedure: Left Heart Cath and Coronary Angiography;  Surgeon: Wendall StadePeter C Nishan, MD;  [facility];  Service: Cardiovascular;  Laterality: N/A;    There were no vitals filed for this visit.      Subjective Assessment - 02/13/16 0850    Subjective No new complaints. Did elliptical without incr pain yesterday and wanted to know if this was OK and I suggested as long as no increased pain it is ok   Diagnostic tests MRI: No tear but stressed tendons                         [facility] Adult PT Treatment/Exercise - 02/13/16 [id number]      Knee/Hip Exercises: Supine   Other Supine Knee/Hip Exercises Ext and  INT rotation x 60 sec or more and added to HEp     Knee/Hip Exercises: Sidelying   Other Sidelying Knee/Hip Exercises stretched IR with bolster between feet for IR active stretch and passive by PT   60 sec  plaus      Ankle Exercises: Stretches   Other Stretch Standing for DF  runners and off step, Pf sitting with foot behind  also for quad stretch and hip extension     Ankle Exercises: Aerobic   Stationary Bike Nustep 5 min x L5  LE only                PT Education - 02/13/16 0820    Education provided Yes   Education Details HEP   Person(Morton) Educated Patient   Methods Explanation;Demonstration;Tactile cues;Verbal cues;Handout   Comprehension Returned demonstration;Verbalized understanding          PT Short Term Goals - 02/11/16 0759      PT SHORT TERM GOAL #1   Title He will be independent with inital HEP   Time 3   Period Weeks   Status New     PT SHORT TERM GOAL #2   Title He will reports  pain decrease 30 % with wlaking at end of work day   Time 3   Period Weeks   Status New           PT Long Term Goals - 02/11/16 0800      PT LONG TERM GOAL #1   Title  He will be independent in all HEP issued   Time 6   Period Weeks   Status New     PT LONG TERM GOAL #2   Title He reports pain eased 75% with walking and at end of work day   Time 6   Period Weeks   Status New     PT LONG TERM GOAL #3   Title Edema decr 3 CM   Time 6   Period Weeks   Status New     PT LONG TERM GOAL #4   Title Hip IR RT -10 degrees to help decr foot pain with walking and standing   Time 6   Period Weeks   Status New               Plan - 02/13/16 [id number]    Clinical Impression Statement He tolerated exercises without incr pain and agreed to HEP. He was late so will add some band exercise for ankle next visit   PT Treatment/Interventions Cryotherapy;Iontophoresis 4mg /ml Dexamethasone;Ultrasound;Passive range of motion;Patient/family education;Manual lymph drainage;Manual techniques;Taping;Balance training;Therapeutic exercise   PT Next Visit Plan Revieww ankle stretches, hip stretches , band exer for ankles  Berg or at least single leg stand timeing   PT Home Exercise  Plan ankle and hip stretches   Consulted and Agree with Plan of Care Patient      Patient will benefit from skilled therapeutic intervention in order to improve the following deficits and impairments:  Abnormal gait, Decreased range of motion, Difficulty walking, Pain, Postural dysfunction, Decreased activity tolerance, Decreased balance  Visit Diagnosis: Pain in right foot  Difficulty in walking, not elsewhere classified  Stiffness of right hip, not elsewhere classified  Stiffness of right foot, not elsewhere classified     Problem List Patient Active Problem List   Diagnosis Date Noted  . Obese 02/05/2016  . Diabetic Charct'Morton arthropathy (HCC)   . Low HDL (under 40) 12/15/2015  . Chest pain 12/13/2015  . Toe osteomyelitis, left (HCC) 07/11/2014  . Diabetes mellitus type II, uncontrolled (HCC) 08/26/2013  . Neuropathy due to secondary diabetes (HCC) 08/26/2013  . HTN (hypertension) 08/26/2013  . Dysmetabolic syndrome X 09/08/2007    Caprice RedChasse, Elizabth Palka M  PT 02/13/2016, 8:53 AM  [facility] [address] [place], [place], [address] Phone: [phone]   Fax:  [phone]  Name: Randall PrestoCarl Morton Car MRN: [id number] Date of Birth: [birth year]

## 2016-02-13 NOTE — Patient Instructions (Signed)
From cabinet issued ankle DF and inversion eversion stretches and written for Pf stretch. 2x/day 30 sec x2-3

## 2016-02-19 ENCOUNTER — Ambulatory Visit: Payer: 59 | Admitting: Physical Therapy

## 2016-02-20 ENCOUNTER — Ambulatory Visit: Payer: 59 | Admitting: Physical Therapy

## 2016-02-20 DIAGNOSIS — M79671 Pain in right foot: Secondary | ICD-10-CM

## 2016-02-20 DIAGNOSIS — R293 Abnormal posture: Secondary | ICD-10-CM

## 2016-02-20 DIAGNOSIS — R262 Difficulty in walking, not elsewhere classified: Secondary | ICD-10-CM

## 2016-02-20 DIAGNOSIS — M25652 Stiffness of left hip, not elsewhere classified: Secondary | ICD-10-CM

## 2016-02-20 DIAGNOSIS — M25674 Stiffness of right foot, not elsewhere classified: Secondary | ICD-10-CM

## 2016-02-20 DIAGNOSIS — M25651 Stiffness of right hip, not elsewhere classified: Secondary | ICD-10-CM

## 2016-02-20 NOTE — Therapy (Signed)
[facility] [address] [place], [place], [address] Phone: [phone]   Fax:  [phone]  Physical Therapy Treatment  Patient Details  Name: Randall Morton MRN: [id number] Date of Birth: [birth year] Referring Provider: Ernestene Kiel, DPM  Encounter Date: 02/20/2016      PT End of Session - 02/20/16 0906    Visit Number 3   Number of Visits 12   Date for PT Re-Evaluation 03/24/16   PT Start Time 0736   PT Stop Time 0800   PT Time Calculation (min) 24 min   Activity Tolerance No increased pain   Behavior During Therapy [facility] for tasks assessed/performed      Past Medical History:  Diagnosis Date  . Diabetes mellitus without complication (HCC)   . Diabetic Charct's arthropathy (HCC)    right foot  . Diabetic retinopathy (HCC)    last checked on 04/2014  . Hypertension   . Osteomyelitis (HCC)    left foot ulcer--> osteo  . Polyneuropathy in diabetes [facility])     Past Surgical History:  Procedure Laterality Date  . CARDIAC CATHETERIZATION N/A 12/17/2015   Procedure: Left Heart Cath and Coronary Angiography;  Surgeon: Wendall Stade, MD;  Location: [facility] INVASIVE CV LAB;  Service: Cardiovascular;  Laterality: N/A;    There were no vitals filed for this visit.      Subjective Assessment - 02/20/16 0736    Subjective Tight, no pain.  I can tell a difference when I don't do my regimin.  I get in the pool.  Stretch and exercises.                           [facility] Adult PT Treatment/Exercise - 02/20/16 0001      Ankle Exercises: Seated   Other Seated Ankle Exercises Foot YOGA:  shortening, toes up/down with moving great toe opposite several reps standing,  sitting practriced Neutral foot with 4 points at base of toes and heel with equal pressure. Theses were difficult.  Patient was able to raise his arch .    Other Seated Ankle Exercises Band red DF/IV/EV 10 X each with cues for technique.  HEP                 PT Education - 02/20/16 0905    Education provided Yes   Education Details anatomy,  Ankle bands, toe YOGA   Person(s) Educated Patient   Methods Explanation;Demonstration;Handout;Verbal cues   Comprehension Verbalized understanding;Returned demonstration          PT Short Term Goals - 02/20/16 0909      PT SHORT TERM GOAL #1   Title He will be independent with inital HEP   Baseline Does not always do   Time 3   Period Weeks   Status On-going     PT SHORT TERM GOAL #2   Title He will reports  pain decrease 30 % with wlaking at end of work day   Time 3   Period Weeks   Status Unable to assess           PT Long Term Goals - 02/11/16 0800      PT LONG TERM GOAL #1   Title He will be independent in all HEP issued   Time 6   Period Weeks   Status New     PT LONG TERM GOAL #2   Title He reports pain eased 75% with walking and at  end of work day   Time 6   Period Weeks   Status New     PT LONG TERM GOAL #3   Title Edema decr 3 CM   Time 6   Period Weeks   Status New     PT LONG TERM GOAL #4   Title Hip IR RT -10 degrees to help decr foot pain with walking and standing   Time 6   Period Weeks   Status New               Plan - 02/20/16 0907    Clinical Impression Statement No pain with today's session.  Progress toward home exercises.  Stiff hips limit some exercises. Arch increased with toe YOGA in standing.   PT Next Visit Plan review stretches, bands YOGA.  BERG or SLS   PT Home Exercise Plan Bands, YOGA   Consulted and Agree with Plan of Care Patient      Patient will benefit from skilled therapeutic intervention in order to improve the following deficits and impairments:  Abnormal gait, Decreased range of motion, Difficulty walking, Pain, Postural dysfunction, Decreased activity tolerance, Decreased balance  Visit Diagnosis: Pain in right foot  Difficulty in walking, not elsewhere classified  Stiffness of right hip, not elsewhere  classified  Stiffness of right foot, not elsewhere classified  Abnormal posture  Stiffness of left hip, not elsewhere classified     Problem List Patient Active Problem List   Diagnosis Date Noted  . Obese 02/05/2016  . Diabetic Charct's arthropathy (HCC)   . Low HDL (under 40) 12/15/2015  . Chest pain 12/13/2015  . Toe osteomyelitis, left (HCC) 07/11/2014  . Diabetes mellitus type II, uncontrolled (HCC) 08/26/2013  . Neuropathy due to secondary diabetes (HCC) 08/26/2013  . HTN (hypertension) 08/26/2013  . Dysmetabolic syndrome X 09/08/2007    Marlisha Vanwyk 02/20/2016, 9:10 AM  [facility] [address] [place], [place], [address] Phone: [phone]   Fax:  [phone]  Name: Randall PrestoCarl S Morton MRN: [id number] Date of Birth: 1971/03/13   Liz BeachKaren Jerrie Schussler, PTA 02/20/16 9:10 AM Phone: [phone] Fax: [phone]

## 2016-02-21 ENCOUNTER — Ambulatory Visit: Payer: 59 | Admitting: Physical Therapy

## 2016-02-21 DIAGNOSIS — R293 Abnormal posture: Secondary | ICD-10-CM

## 2016-02-21 DIAGNOSIS — M25651 Stiffness of right hip, not elsewhere classified: Secondary | ICD-10-CM

## 2016-02-21 DIAGNOSIS — M79671 Pain in right foot: Secondary | ICD-10-CM | POA: Diagnosis not present

## 2016-02-21 DIAGNOSIS — M25674 Stiffness of right foot, not elsewhere classified: Secondary | ICD-10-CM

## 2016-02-21 DIAGNOSIS — R262 Difficulty in walking, not elsewhere classified: Secondary | ICD-10-CM

## 2016-02-21 DIAGNOSIS — M25675 Stiffness of left foot, not elsewhere classified: Secondary | ICD-10-CM

## 2016-02-21 NOTE — Therapy (Signed)
[place], [place], [address] Phone: [phone]   Fax:  [phone]  Physical Therapy Treatment  Patient Details  Name: Randall Morton MRN: [id number] Date of Birth: 05-06-1971 Referring Provider: Tyson Dense, DPM  Encounter Date: 02/21/2016      PT End of Session - 02/21/16 1441    Visit Number 4   Number of Visits 12   Date for PT Re-Evaluation 03/24/16   PT Start Time 0737   PT Stop Time 0802   PT Time Calculation (min) 25 min   Activity Tolerance Patient tolerated treatment well   Behavior During Therapy [facility] for tasks assessed/performed      Past Medical History:  Diagnosis Date  . Diabetes mellitus without complication ([place])   . Diabetic Charct's arthropathy ([place])    right foot  . Diabetic retinopathy ([place])    last checked on 04/2014  . Hypertension   . Osteomyelitis ([place])    left foot ulcer--> osteo  . Polyneuropathy in diabetes [facility])     Past Surgical History:  Procedure Laterality Date  . CARDIAC CATHETERIZATION N/A 12/17/2015   Procedure: Left Heart Cath and Coronary Angiography;  Surgeon: Josue Hector, MD;  Location: [facility] CV LAB;  Service: Cardiovascular;  Laterality: N/A;    There were no vitals filed for this visit.      Subjective Assessment - 02/21/16 0741    Subjective Pain at end of work day 3/10.  I am mostly concerned with my balance.   Currently in Pain? Yes   Pain Score 3    Pain Location Foot   Pain Orientation Left   Pain Descriptors / Indicators --  stiff   Aggravating Factors  end of day   Pain Relieving Factors orthotics   Multiple Pain Sites No                         [facility] Adult PT Treatment/Exercise - 02/21/16 0001      Berg Balance Test   Sit to Stand Able to stand without using hands and stabilize independently   Standing Unsupported Able to stand safely 2 minutes   Sitting with Back Unsupported but Feet Supported on Floor or  Stool Able to sit safely and securely 2 minutes   Stand to Sit Sits safely with minimal use of hands   Transfers Able to transfer safely, minor use of hands   Standing Unsupported with Eyes Closed Able to stand 10 seconds safely   Standing Ubsupported with Feet Together Able to place feet together independently and stand 1 minute safely   From Standing, Reach Forward with Outstretched Arm Can reach forward >12 cm safely (5")   From Standing Position, Pick up Object from Floor Able to pick up shoe safely and easily   From Standing Position, Turn to Look Behind Over each Shoulder Looks behind one side only/other side shows less weight shift   Turn 360 Degrees Able to turn 360 degrees safely but slowly   Standing Unsupported, Alternately Place Feet on Step/Stool Able to stand independently and safely and complete 8 steps in 20 seconds  10 seconds   Standing Unsupported, One Foot in Front Able to place foot tandem independently and hold 30 seconds   Standing on One Leg Able to lift leg independently and hold > 10 seconds  LT 13, RT  9/10   Total Score 52     Ankle Exercises: Seated  Other Seated Ankle Exercises 10 x bands as previous,  needed to use yellow band for IV                PT Education - 02/20/16 0905    Education provided Yes   Education Details anatomy,  Ankle bands, toe YOGA   Person(s) Educated Patient   Methods Explanation;Demonstration;Handout;Verbal cues   Comprehension Verbalized understanding;Returned demonstration          PT Short Term Goals - 02/20/16 0909      PT SHORT TERM GOAL #1   Title He will be independent with inital HEP   Baseline Does not always do   Time 3   Period Weeks   Status On-going     PT SHORT TERM GOAL #2   Title He will reports  pain decrease 30 % with wlaking at end of work day   Time 3   Period Weeks   Status Unable to assess           PT Long Term Goals - 02/11/16 0800      PT LONG TERM GOAL #1   Title He will  be independent in all HEP issued   Time 6   Period Weeks   Status New     PT LONG TERM GOAL #2   Title He reports pain eased 75% with walking and at end of work day   Time 6   Period Weeks   Status New     PT LONG TERM GOAL #3   Title Edema decr 3 CM   Time 6   Period Weeks   Status New     PT LONG TERM GOAL #4   Title Hip IR RT -10 degrees to help decr foot pain with walking and standing   Time 6   Period Weeks   Status New               Plan - 02/21/16 1442    Clinical Impression Statement 52/56 BERG.  No pain increase with today's session.  No new goals met.   PT Next Visit Plan PT to review BERG info with patient.  Balance exercises.  Strengthening   PT Home Exercise Plan continue   Consulted and Agree with Plan of Care Patient      Patient will benefit from skilled therapeutic intervention in order to improve the following deficits and impairments:     Visit Diagnosis: Pain in right foot  Difficulty in walking, not elsewhere classified  Stiffness of right hip, not elsewhere classified  Stiffness of right foot, not elsewhere classified  Abnormal posture  Stiffness of left foot, not elsewhere classified     Problem List Patient Active Problem List   Diagnosis Date Noted  . Obese 02/05/2016  . Diabetic Charct's arthropathy ([place])   . Low HDL (under 40) 12/15/2015  . Chest pain 12/13/2015  . Toe osteomyelitis, left ([place]) 07/11/2014  . Diabetes mellitus type II, uncontrolled ([place]) 08/26/2013  . Neuropathy due to secondary diabetes ([place]) 08/26/2013  . HTN (hypertension) 08/26/2013  . Dysmetabolic syndrome X 00/93/8182    HARRIS,KAREN 02/21/2016, 2:45 PM  [facility] [address] [place], [place], [address] Phone: [phone]   Fax:  [phone]  Name: Randall Morton MRN: [id number] Date of Birth: 1970/11/17   Melvenia Needles, PTA 02/21/16 2:45 PM Phone: [phone] Fax: [phone]

## 2016-02-27 ENCOUNTER — Ambulatory Visit: Payer: 59 | Attending: Podiatry

## 2016-02-27 DIAGNOSIS — R262 Difficulty in walking, not elsewhere classified: Secondary | ICD-10-CM | POA: Diagnosis present

## 2016-02-27 DIAGNOSIS — R293 Abnormal posture: Secondary | ICD-10-CM | POA: Diagnosis present

## 2016-02-27 DIAGNOSIS — M25674 Stiffness of right foot, not elsewhere classified: Secondary | ICD-10-CM | POA: Diagnosis present

## 2016-02-27 DIAGNOSIS — M25651 Stiffness of right hip, not elsewhere classified: Secondary | ICD-10-CM

## 2016-02-27 DIAGNOSIS — M79671 Pain in right foot: Secondary | ICD-10-CM

## 2016-02-27 NOTE — Patient Instructions (Signed)
Written for step stand on floor and 2x6 and balance stand cross on 2x4 and on foam  All with cues for home safety and use of support , manageing narrowness of support and no increase in pain

## 2016-02-27 NOTE — Therapy (Signed)
[facility] [address] [place], [place], [address] Phone: [phone]   Fax:  [phone]  Physical Therapy Treatment  Patient Details  Name: Randall Morton MRN: [id number] Date of Birth: 08/26/1970 Referring Provider: Ernestene KielMax Hyatt, DPM  Encounter Date: 02/27/2016      PT End of Session - 02/27/16 0708    Visit Number 5   Number of Visits 12   Date for PT Re-Evaluation 03/24/16   Authorization Type [employer]   PT Start Time 0705   PT Stop Time 0750   PT Time Calculation (min) 45 min   Activity Tolerance Patient tolerated treatment well   Behavior During Therapy [facility] for tasks assessed/performed      Past Medical History:  Diagnosis Date  . Diabetes mellitus without complication (HCC)   . Diabetic Charct's arthropathy (HCC)    right foot  . Diabetic retinopathy (HCC)    last checked on 04/2014  . Hypertension   . Osteomyelitis (HCC)    left foot ulcer--> osteo  . Polyneuropathy in diabetes [facility](HCC)     Past Surgical History:  Procedure Laterality Date  . CARDIAC CATHETERIZATION N/A 12/17/2015   Procedure: Left Heart Cath and Coronary Angiography;  Surgeon: Wendall StadePeter C Nishan, MD;  [facility];  Service: Cardiovascular;  Laterality: N/A;    There were no vitals filed for this visit.                       [facility] Adult PT Treatment/Exercise - 02/27/16 0001      Neuro Re-ed    Neuro Re-ed Details  Worked on standing balance with foot on chair , step stand on floor and on 8 inch platform and balance on foam and over 1/2 roll  all with 4 foot dowe fo support an dcies for safety at home     Ankle Exercises: Seated   Other Seated Ankle Exercises x15 reps Morton 4 way     BIKE x 6 min L2           PT Education - 02/27/16 0801    Education provided Yes   Education Details Balance activity and need for safety   Person(s) Educated Patient   Methods Explanation;Demonstration;Tactile cues;Verbal  cues;Handout   Comprehension Returned demonstration;Verbalized understanding          PT Short Term Goals - 02/20/16 0909      PT SHORT TERM GOAL #1   Title He will be independent with inital HEP   Baseline Does not always do   Time 3   Period Weeks   Status On-going     PT SHORT TERM GOAL #2   Title He will reports  pain decrease 30 % with wlaking at end of work day   Time 3   Period Weeks   Status Unable to assess           PT Long Term Goals - 02/11/16 0800      PT LONG TERM GOAL #1   Title He will be independent in all HEP issued   Time 6   Period Weeks   Status New     PT LONG TERM GOAL #2   Title He reports pain eased 75% with walking and at end of work day   Time 6   Period Weeks   Status New     PT LONG TERM GOAL #3   Title Edema decr 3 CM   Time 6  Period Weeks   Status New     PT LONG TERM GOAL #4   Title Hip IR RT -10 degrees to help decr foot pain with walking and standing   Time 6   Period Weeks   Status New               Plan - 02/27/16 0708    Clinical Impression Statement He did well withnbalance activity for home and he was cued to make sure he was safe and would not fall and to modify as needed for challenge but not risk   PT Treatment/Interventions Cryotherapy;Iontophoresis 4mg /ml Dexamethasone;Ultrasound;Passive range of motion;Patient/family education;Manual lymph drainage;Manual techniques;Taping;Balance training;Therapeutic exercise   PT Next Visit Plan REview balance and incr band to green or blue   Consulted and Agree with Plan of Care Patient      Patient will benefit from skilled therapeutic intervention in order to improve the following deficits and impairments:  Abnormal gait, Decreased range of motion, Difficulty walking, Pain, Postural dysfunction, Decreased activity tolerance, Decreased balance  Visit Diagnosis: Pain in right foot  Difficulty in walking, not elsewhere classified  Stiffness of right hip, not  elsewhere classified  Stiffness of right foot, not elsewhere classified     Problem List Patient Active Problem List   Diagnosis Date Noted  . Obese 02/05/2016  . Diabetic Charct's arthropathy (HCC)   . Low HDL (under 40) 12/15/2015  . Chest pain 12/13/2015  . Toe osteomyelitis, left (HCC) 07/11/2014  . Diabetes mellitus type II, uncontrolled (HCC) 08/26/2013  . Neuropathy due to secondary diabetes (HCC) 08/26/2013  . HTN (hypertension) 08/26/2013  . Dysmetabolic syndrome X 09/08/2007    Randall Morton PT 02/27/2016, 8:03 AM  [facility] [address] [place], [place], [address] Phone: [phone]   Fax:  [phone]  Name: Randall Morton MRN: [id number] Date of Birth: [birth year]

## 2016-02-29 ENCOUNTER — Ambulatory Visit: Payer: 59

## 2016-02-29 DIAGNOSIS — M79671 Pain in right foot: Secondary | ICD-10-CM | POA: Diagnosis not present

## 2016-02-29 DIAGNOSIS — M25651 Stiffness of right hip, not elsewhere classified: Secondary | ICD-10-CM

## 2016-02-29 DIAGNOSIS — M25674 Stiffness of right foot, not elsewhere classified: Secondary | ICD-10-CM

## 2016-02-29 DIAGNOSIS — R262 Difficulty in walking, not elsewhere classified: Secondary | ICD-10-CM

## 2016-02-29 NOTE — Therapy (Signed)
[place], [place], [address] Phone: [phone]   Fax:  [phone]  Physical Therapy Treatment  Patient Details  Name: Randall Morton MRN: [id number] Date of Birth: 11/16/1970 Referring Provider: Tyson Dense, DPM  Encounter Date: 02/29/2016      PT End of Session - 02/29/16 0710    Visit Number 6   Number of Visits 12   Date for PT Re-Evaluation 03/24/16   Authorization Type [employer]   PT Start Time 0707  late 7 min   PT Stop Time 0750   PT Time Calculation (min) 43 min   Activity Tolerance Patient tolerated treatment well   Behavior During Therapy [facility] for tasks assessed/performed      Past Medical History:  Diagnosis Date  . Diabetes mellitus without complication ([place])   . Diabetic Charct's arthropathy ([place])    right foot  . Diabetic retinopathy ([place])    last checked on 04/2014  . Hypertension   . Osteomyelitis ([place])    left foot ulcer--> osteo  . Polyneuropathy in diabetes [facility])     Past Surgical History:  Procedure Laterality Date  . CARDIAC CATHETERIZATION N/A 12/17/2015   Procedure: Left Heart Cath and Coronary Angiography;  Surgeon: Josue Hector, MD;  Location: [place] CV LAB;  Service: Cardiovascular;  Laterality: N/A;    There were no vitals filed for this visit.      Subjective Assessment - 02/29/16 0708    Subjective Tired last night after work but relaxed afdter shower, No pain today.   Limitations Sitting   Currently in Pain? No/denies            [facility] PT Assessment - 02/29/16 0001      Figure 8 Edema   Figure 8 - Right  67 cm                     [facility] Adult PT Treatment/Exercise - 02/29/16 3419      Neuro Re-ed    Neuro Re-ed Details  Worked on standing balance in , step stand on floor and balance on rocker board  all with 4 foot dowel fo support  for safety at home. Also BAPS x 20 circles that he was abel to do after the first 10 reps      Ankle  Exercises: Aerobic   Stationary Bike Nustep 6 min x L5  LE only     Ankle Exercises: Seated   Other Seated Ankle Exercises x15 reps green 4 way then stannding arch raises x 15      Also balance with step to  18 inch height with single leg stand x 15 RT and LT             PT Short Term Goals - 02/29/16 6222      PT SHORT TERM GOAL #1   Title He will be independent with inital HEP   Status Achieved     PT SHORT TERM GOAL #2   Title He will reports  pain decrease 30 % with walking at end of work day   Status Partially Met           PT Long Term Goals - 02/29/16 0711      PT LONG TERM GOAL #1   Title He will be independent in all HEP issued   Status On-going     PT LONG TERM GOAL #2   Title He reports pain eased 75% with  walking and at end of work day   Status On-going     PT LONG TERM GOAL #3   Title Edema decr 3 CM   Baseline 1 cm to 67 cm   Status On-going               Plan - 02/29/16 0710    Clinical Impression Statement He reported no pain after session . mild decreased edema. Balance noticable decr without vision so we worked on this and pt was cautioned if done at home to have wider BOS and support externally like wall or door frame.    PT Treatment/Interventions Cryotherapy;Iontophoresis 4m/ml Dexamethasone;Ultrasound;Passive range of motion;Patient/family education;Manual lymph drainage;Manual techniques;Taping;Balance training;Therapeutic exercise   PT Next Visit Plan Continue strength and stretching , balance,or strength blue band f   Consulted and Agree with Plan of Care Patient      Patient will benefit from skilled therapeutic intervention in order to improve the following deficits and impairments:  Abnormal gait, Decreased range of motion, Difficulty walking, Pain, Postural dysfunction, Decreased activity tolerance, Decreased balance  Visit Diagnosis: Pain in right foot  Difficulty in walking, not elsewhere classified  Stiffness of  right hip, not elsewhere classified  Stiffness of right foot, not elsewhere classified     Problem List Patient Active Problem List   Diagnosis Date Noted  . Obese 02/05/2016  . Diabetic [place]'s arthropathy ([place]   . Low HDL (under 40) 12/15/2015  . Chest pain 12/13/2015  . To[place]itis, left ([place] 07/11/2014  . Diabetes mellitus type II, uncontrolled ([place] 08/26/2013  . Neuropathy due to secondary diabetes ([place] 08/26/2013  . HTN (hypertension) 08/26/2013  . Dysmetabolic syndrome X 003/97/9536   CDarrel HooverPT 02/29/2016, 7:54 AM  [facility] [address] [address]Phone: [phone]  Fax:  [phone] Name: Randall BRUEMRN: [id number] of Birth: 313-Dec-1972

## 2016-03-03 ENCOUNTER — Ambulatory Visit: Payer: 59

## 2016-03-05 ENCOUNTER — Ambulatory Visit: Payer: 59

## 2016-03-05 DIAGNOSIS — M79671 Pain in right foot: Secondary | ICD-10-CM | POA: Diagnosis not present

## 2016-03-05 DIAGNOSIS — M25651 Stiffness of right hip, not elsewhere classified: Secondary | ICD-10-CM

## 2016-03-05 DIAGNOSIS — R293 Abnormal posture: Secondary | ICD-10-CM

## 2016-03-05 DIAGNOSIS — M25674 Stiffness of right foot, not elsewhere classified: Secondary | ICD-10-CM

## 2016-03-05 DIAGNOSIS — R262 Difficulty in walking, not elsewhere classified: Secondary | ICD-10-CM

## 2016-03-05 NOTE — Patient Instructions (Signed)
exercise on incline for ankle balance and strength and Brettzel stretch (1x/day 60 sec RT and LT)  reviewed on you tube and pt demo.

## 2016-03-05 NOTE — Therapy (Signed)
[place], [place], [address] Phone: [phone]   Fax:  [phone]  Physical Therapy Treatment/Discharge  Patient Details  Name: Randall Morton MRN: [id number] Date of Birth: [birth year] Referring Provider: Tyson Dense, DPM  Encounter Date: 03/05/2016      PT End of Session - 03/05/16 0707    Visit Number 7   Number of Visits 12   Date for PT Re-Evaluation 03/24/16   Authorization Type [employer]   PT Start Time 0705   PT Stop Time 0743   PT Time Calculation (min) 38 min   Activity Tolerance Patient tolerated treatment well   Behavior During Therapy [facility] for tasks assessed/performed      Past Medical History:  Diagnosis Date  . Diabetes mellitus without complication ([place])   . 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 Diabetic retinopathy ([place])    last checked on 04/2014  . Hypertension   . Osteomyelitis ([place])    left foot ulcer--> osteo  . Polyneuropathy in diabetes [facility])     Past Surgical History:  Procedure Laterality Date  . CARDIAC CATHETERIZATION N/A 12/17/2015   Procedure: Left Heart Cath and Coronary Angiography;  Surgeon: Josue Hector, MD;  Location: [place] [facility];  Service: Cardiovascular;  Laterality: N/A;    There were no vitals filed for this visit.      Subjective Assessment - 03/05/16 0721    Subjective Feel like I have the tools and pain improved so today can be last day   Currently in Pain? No/denies                         [facility] Adult PT Treatment/Exercise - 03/05/16 0001      Ankle Exercises: Standing   Heel Raises 15 reps   Heel Raises Limitations heel and toe raises on incline  and walked across incline LT and RT x 5  all for use at home   Toe Raise 15 reps     Ankle Exercises: Stretches   Other Stretch Bretzel stretching RT and LT x 60 sec after PT demo and instruction on how to do stretch                PT Education -  03/05/16 0751    Education provided Yes   Education Details exercise on oincline, Brettzel stretch   Person(s) Educated Patient   Methods Explanation;Demonstration;Tactile cues;Verbal cues;Handout;Other (comment)  you tube   Comprehension Verbalized understanding;Returned demonstration          PT Short Term Goals - 03/05/16 0753      PT SHORT TERM GOAL #1   Title He will be independent with inital HEP   Status Achieved     PT SHORT TERM GOAL #2   Title He will reports  pain decrease 30 % with walking at end of work day   Status Achieved           PT Long Term Goals - 03/05/16 0753      PT LONG TERM GOAL #1   Title He will be independent in all HEP issued   Status Achieved     PT LONG TERM GOAL #2   Title He reports pain eased 75% with walking and at end of work day   Status Partially Met     PT LONG TERM GOAL #3   Title Edema decr 3 CM   Baseline 1  cm to 67 cm   Status On-going     PT LONG TERM GOAL #4   Title Hip IR RT -10 degrees to help decr foot pain with walking and standing   Baseline 7 degrees   Status Partially Met               Plan - 03/05/16 0707    Clinical Impression Statement Mr Rampy reports he is very comfortable with his HEP and feel he is ready to carry on at home . He is generally haveing little or no pain in foot except with long time on feet.    PT Treatment/Interventions Cryotherapy;Iontophoresis 63m/ml Dexamethasone;Ultrasound;Passive range of motion;Patient/family education;Manual lymph drainage;Manual techniques;Taping;Balance training;Therapeutic exercise   PT Next Visit Plan Discharge with HEP   Consulted and Agree with Plan of Care Patient      Patient will benefit from skilled therapeutic intervention in order to improve the following deficits and impairments:  Abnormal gait, Decreased range of motion, Difficulty walking, Pain, Postural dysfunction, Decreased activity tolerance, Decreased balance  Visit Diagnosis: Pain in  right foot  Difficulty in walking, not elsewhere classified  Stiffness of right hip, not elsewhere classified  Stiffness of right foot, not elsewhere classified  Abnormal posture     Problem List Patient Active Problem List   Diagnosis Date Noted  . Obese 02/05/2016  . Diabetic Charct's arthropathy ([place]   . Low HDL (under 40) 12/15/2015  . Chest pain 12/13/2015  . Toe osteomyelitis, left ([place] 07/11/2014  . Diabetes mellitus type II, uncontrolled ([place] 08/26/2013  . Neuropathy due to secondary diabetes ([place] 08/26/2013  . HTN (hypertension) 08/26/2013  . Dysmetabolic syndrome X 006/06/5613   CDarrel HooverPT 03/05/2016, 7:57 AM  [facility] [address]Phone: [phone]  Fax:  [phone] Name: CHENDERSON FRAMPTONMRN: [id number] of Birth: [birth year] PHYSICAL THERAPY DISCHARGE SUMMARY  Visits from Start of Care: 7  Current functional level related to goals / functional outcomes: See above   Remaining deficits: See above   Education / Equipment: HEP Plan: Patient agrees to discharge.  Patient goals were partially met. Patient is being discharged due to being pleased with the current functional level.  ?????

## 2016-03-10 ENCOUNTER — Ambulatory Visit: Payer: 59

## 2016-03-25 ENCOUNTER — Ambulatory Visit (INDEPENDENT_AMBULATORY_CARE_PROVIDER_SITE_OTHER): Payer: 59 | Admitting: Sports Medicine

## 2016-03-25 ENCOUNTER — Encounter: Payer: Self-pay | Admitting: Sports Medicine

## 2016-03-25 DIAGNOSIS — M1460 Charcot's joint, unspecified site: Secondary | ICD-10-CM

## 2016-03-25 DIAGNOSIS — M79671 Pain in right foot: Secondary | ICD-10-CM

## 2016-03-25 NOTE — Progress Notes (Signed)
Subjective: Randall Morton is a 45 y.o. male patient with history of diabetes who presents to office today for follow up right foot pain and swelling. Had MRI done and is here for results. Reports that he has been wearing cam boot at work. Denies any other symptoms.   Reports blood sugars are better.  Patient Active Problem List   Diagnosis Date Noted  . Obese 02/05/2016  . Diabetic Charct's arthropathy ([place])   . Low HDL (under 40) 12/15/2015  . Chest pain 12/13/2015  . Toe osteomyelitis, left ([place]) 07/11/2014  . Diabetes mellitus type II, uncontrolled ([place]) 08/26/2013  . Neuropathy due to secondary diabetes ([place]) 08/26/2013  . HTN (hypertension) 08/26/2013  . Dysmetabolic syndrome X 64/33/2951   Current Outpatient Prescriptions on File Prior to Visit  Medication Sig Dispense Refill  . blood glucose meter kit and supplies KIT Dispense based on patient and insurance preference. Use up to four times daily as directed. (FOR ICD-9 250.00, 250.01). 1 each 0  . furosemide (LASIX) 20 MG tablet Take 1 tablet (20 mg total) by mouth daily. 30 tablet 5  . glyBURIDE (DIABETA) 5 MG tablet Take two tablets with breakfast and one tablet with dinner 90 tablet 5  . ibuprofen (ADVIL,MOTRIN) 800 MG tablet Take 800 mg by mouth every 8 (eight) hours as needed for mild pain.    . L-ARGININE PO Take 1 tablet by mouth daily.    [name] lisinopril (PRINIVIL,ZESTRIL) 10 MG tablet Take 1 tablet (10 mg total) by mouth daily. 30 tablet 5  . metFORMIN (GLUCOPHAGE) 500 MG tablet Take 1 tablet (500 mg total) by mouth 2 (two) times daily with a meal. 60 tablet 5  . Multiple Vitamins-Minerals (MULTIVITAMIN WITH MINERALS) tablet Take 1 tablet by mouth daily.     No current facility-administered medications on file prior to visit.    No Known Allergies  Recent Results (from the past 2160 hour(s))  Comprehensive metabolic panel     Status: Abnormal   Collection Time: 02/05/16  4:26 PM  Result Value Ref Range   Sodium 138 135  - 145 mEq/L   Potassium 4.0 3.5 - 5.1 mEq/L   Chloride 103 96 - 112 mEq/L   CO2 27 19 - 32 mEq/L   Glucose, Bld 166 (H) 70 - 99 mg/dL   BUN 13 6 - 23 mg/dL   Creatinine, Ser 0.89 0.40 - 1.50 mg/dL   Total Bilirubin 0.6 0.2 - 1.2 mg/dL   Alkaline Phosphatase 77 39 - 117 U/L   AST 26 0 - 37 U/L   ALT 29 0 - 53 U/L   Total Protein 8.1 6.0 - 8.3 g/dL   Albumin 4.2 3.5 - 5.2 g/dL   Calcium 9.6 8.4 - 10.5 mg/dL   GFR 117.62 >60.00 mL/min  Hemoglobin A1c     Status: Abnormal   Collection Time: 02/05/16  4:26 PM  Result Value Ref Range   Hgb A1c MFr Bld 7.9 (H) 4.6 - 6.5 %    Comment: Glycemic Control Guidelines for People with Diabetes:Non Diabetic:  <6%Goal of Therapy: <7%Additional Action Suggested:  >8%   TSH     Status: None   Collection Time: 02/05/16  4:26 PM  Result Value Ref Range   TSH 1.10 0.35 - 4.50 uIU/mL  Testosterone, Free, Total, SHBG     Status: Abnormal   Collection Time: 02/05/16  4:28 PM  Result Value Ref Range   Testosterone 409 264 - 916 ng/dL    Comment: Adult  male reference interval is based on a population of healthy nonobese males (BMI <30) between 48 and 29 years old. Tatum, [place] [phone]. PMID: 15379432.    Testosterone, Free 7.8 6.8 - 21.5 pg/mL   Sex Hormone Binding 58.6 (H) 16.5 - 55.9 nmol/L    Objective: General: Patient is awake, alert, and oriented x 3 and in no acute distress.  Integument: Skin is warm, dry and supple bilateral. Nails are short and thick, consistent with onychomycosis, 1-5 bilateral. No signs of infection. No open lesions or preulcerative lesions present bilateral. Remaining integument unremarkable.  Vasculature:  Dorsalis Pedis pulse 2/4 bilateral. Posterior Tibial pulse  2/4 bilateral.  Capillary fill time <3 sec 1-5 bilateral. Temperature gradient within normal limits. No varicosities present bilateral. Focal edema present right midfoot with associated charcot changes.   Neurology: The patient has  diminished sensation measured with a 5.07/10g Semmes Weinstein Monofilament at all pedal sites bilateral . Vibratory sensation diminished bilateral with tuning fork. No Babinski sign present bilateral.   Musculoskeletal: Rockerbottom foot and charcot on right. No pain to palpation today. Muscular strength 5/5 in all lower extremity muscular groups bilateral. No tenderness with calf compression bilateral.  MRI IMPRESSION: 1. Extensive midfoot Charcot arthropathy with fragmentation, fracture, and extensive edema involving the bones of the midfoot and metatarsal bases. Surrounding soft tissue edema noted. I do not see any draining sinus tract or obvious abscess to suggest infection, but correlate with clinical signs of infection. 2. Attenuated tibiospring and tibionavicular portions of the deltoid ligament, likely torn. 3. Distal tibialis posterior tendinopathy may reflect tibialis posterior dysfunction. 4. Distal Achilles tendinopathy. 5. Extensive thickening of the medial band of the plantar fascia proximally. There is also some thickening of the lateral band of the plantar fascia distally. Although probably from plantar fasciitis, a component of plantar fibromatosis is not excluded. 6. 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  Assessment and Plan: Problem List Items Addressed This Visit    None    Visit Diagnoses    Charcot's arthropathy    -  Primary   Foot pain, right          -Examined patient -MRI reviewed -Discussed and educated patient on diabetic charcot -Stressed the importance of good glycemic control and the detriment of not  controlling glucose levels in relation to the foot. -Dispensed surgi-tube compression for swelling -Recommend continue with Cam boot -Answered all patient questions -Patient to return  in 3 weeks for at risk foot care with Dr. Milinda Pointer -Patient would benefit in future from crow  walker or a more dense insole once Charcot changes have ceased  -Patient advised to call the office if any problems or questions arise in the meantime.  Landis Martins, DPM

## 2016-04-01 ENCOUNTER — Other Ambulatory Visit (INDEPENDENT_AMBULATORY_CARE_PROVIDER_SITE_OTHER): Payer: 59

## 2016-04-01 ENCOUNTER — Encounter: Payer: Self-pay | Admitting: Internal Medicine

## 2016-04-01 ENCOUNTER — Ambulatory Visit (INDEPENDENT_AMBULATORY_CARE_PROVIDER_SITE_OTHER): Payer: 59 | Admitting: Internal Medicine

## 2016-04-01 VITALS — BP 120/80 | HR 105 | Temp 98.6°F | Resp 16 | Ht 74.0 in | Wt 301.5 lb

## 2016-04-01 DIAGNOSIS — E118 Type 2 diabetes mellitus with unspecified complications: Secondary | ICD-10-CM

## 2016-04-01 DIAGNOSIS — I10 Essential (primary) hypertension: Secondary | ICD-10-CM

## 2016-04-01 DIAGNOSIS — Z23 Encounter for immunization: Secondary | ICD-10-CM | POA: Diagnosis not present

## 2016-04-01 DIAGNOSIS — R197 Diarrhea, unspecified: Secondary | ICD-10-CM | POA: Diagnosis not present

## 2016-04-01 LAB — COMPREHENSIVE METABOLIC PANEL
ALBUMIN: 4 g/dL (ref 3.5–5.2)
ALT: 16 U/L (ref 0–53)
AST: 13 U/L (ref 0–37)
Alkaline Phosphatase: 75 U/L (ref 39–117)
BUN: 13 mg/dL (ref 6–23)
CALCIUM: 9.6 mg/dL (ref 8.4–10.5)
CHLORIDE: 106 meq/L (ref 96–112)
CO2: 28 mEq/L (ref 19–32)
Creatinine, Ser: 0.94 mg/dL (ref 0.40–1.50)
GFR: 110.36 mL/min (ref >60.00)
Glucose, Bld: 176 mg/dL — ABNORMAL HIGH (ref 70–99)
POTASSIUM: 4.2 meq/L (ref 3.5–5.1)
SODIUM: 140 meq/L (ref 135–145)
Total Bilirubin: 0.4 mg/dL (ref 0.2–1.2)
Total Protein: 8.1 g/dL (ref 6.0–8.3)

## 2016-04-01 LAB — CBC WITH DIFFERENTIAL/PLATELET
BASOS PCT: 0.8 % (ref 0.0–3.0)
Basophils Absolute: 0.1 10*3/uL (ref 0.0–0.1)
EOS PCT: 2.5 % (ref 0.0–5.0)
Eosinophils Absolute: 0.2 10*3/uL (ref 0.0–0.7)
HEMATOCRIT: 43.8 % (ref 39.0–52.0)
HEMOGLOBIN: 15.1 g/dL (ref 13.0–17.0)
LYMPHS PCT: 36.8 % (ref 12.0–46.0)
Lymphs Abs: 3.1 10*3/uL (ref 0.7–4.0)
MCHC: 34.5 g/dL (ref 30.0–36.0)
MCV: 88.2 fl (ref 78.0–100.0)
MONOS PCT: 7.6 % (ref 3.0–12.0)
Monocytes Absolute: 0.6 10*3/uL (ref 0.1–1.0)
Neutro Abs: 4.4 10*3/uL (ref 1.4–7.7)
Neutrophils Relative %: 52.3 % (ref 43.0–77.0)
Platelets: 253 10*3/uL (ref 150.0–400.0)
RBC: 4.97 Mil/uL (ref 4.22–5.81)
RDW: 12.9 % (ref 11.5–15.5)
WBC: 8.4 10*3/uL (ref 4.0–10.5)

## 2016-04-01 LAB — VITAMIN B12: Vitamin B-12: 524 pg/mL (ref 211–911)

## 2016-04-01 LAB — TSH: TSH: 1.19 u[IU]/mL (ref 0.35–4.50)

## 2016-04-01 LAB — SEDIMENTATION RATE: Sed Rate: 26 mm/hr — ABNORMAL HIGH (ref 0–15)

## 2016-04-01 LAB — FOLATE: Folate: 12.6 ng/mL (ref >5.9)

## 2016-04-01 NOTE — Patient Instructions (Signed)

## 2016-04-01 NOTE — Progress Notes (Signed)
Pre visit review using our clinic review tool, if applicable. No additional management support is needed unless otherwise documented below in the visit note. 

## 2016-04-01 NOTE — Progress Notes (Signed)
Subjective:  Patient ID: Randall Morton, male    DOB: 07/10/1970  Age: 45 y.o. MRN: [id number]  CC: Diarrhea   HPI Randall Morton presents for concerns about recurrent, intermittent episodes of diarrhea for about 6 weeks, having loose stools up to 4 times per day with diffuse abd cramping.  Outpatient Medications Prior to Visit  Medication Sig Dispense Refill  . blood glucose meter kit and supplies KIT Dispense based on patient and insurance preference. Use up to four times daily as directed. (FOR ICD-9 250.00, 250.01). 1 each 0  . furosemide (LASIX) 20 MG tablet Take 1 tablet (20 mg total) by mouth daily. 30 tablet 5  . glyBURIDE (DIABETA) 5 MG tablet Take two tablets with breakfast and one tablet with dinner 90 tablet 5  . ibuprofen (ADVIL,MOTRIN) 800 MG tablet Take 800 mg by mouth every 8 (eight) hours as needed for mild pain.    . L-ARGININE PO Take 1 tablet by mouth daily.    [name] lisinopril (PRINIVIL,ZESTRIL) 10 MG tablet Take 1 tablet (10 mg total) by mouth daily. 30 tablet 5  . Multiple Vitamins-Minerals (MULTIVITAMIN WITH MINERALS) tablet Take 1 tablet by mouth daily.    . metFORMIN (GLUCOPHAGE) 500 MG tablet Take 1 tablet (500 mg total) by mouth 2 (two) times daily with a meal. 60 tablet 5   No facility-administered medications prior to visit.     ROS Review of Systems  Constitutional: Positive for unexpected weight change (2 lb weight loss over 2 months). Negative for appetite change, chills, diaphoresis and fatigue.  HENT: Negative.  Negative for sore throat and trouble swallowing.   Eyes: Negative for visual disturbance.  Respiratory: Negative.  Negative for cough, choking, chest tightness, shortness of breath, wheezing and stridor.   Cardiovascular: Negative.  Negative for chest pain, palpitations and leg swelling.  Gastrointestinal: Positive for abdominal pain and diarrhea. Negative for blood in stool, constipation, nausea and vomiting.  Endocrine: Negative for polydipsia,  polyphagia and polyuria.  Genitourinary: Negative.  Negative for decreased urine volume and difficulty urinating.  Musculoskeletal: Negative.  Negative for back pain and myalgias.  Skin: Negative.  Negative for rash.  Neurological: Negative.  Negative for dizziness and light-headedness.  Hematological: Negative for adenopathy. Does not bruise/bleed easily.  Psychiatric/Behavioral: Negative.     Objective:  BP 120/80 (BP Location: Left Arm, Patient Position: Sitting, Cuff Size: Large)   Pulse (!) 105   Temp 98.6 F (37 C) (Oral)   Resp 16   Ht _0  (1.88 m)   Wt (!) 301 lb 8 oz (136.8 kg)   SpO2 96%   BMI 38.71 kg/m   BP Readings from Last 3 Encounters:  04/01/16 120/80  02/05/16 122/84  12/27/15 132/88    Wt Readings from Last 3 Encounters:  04/01/16 (!) 301 lb 8 oz (136.8 kg)  02/05/16 (!) 303 lb (137.4 kg)  12/17/15 (!) 303 lb 12.8 oz (137.8 kg)    Physical Exam  Constitutional: He is oriented to person, place, and time.  Non-toxic appearance. He does not have a sickly appearance. He does not appear ill. No distress.  HENT:  Mouth/Throat: Oropharynx is clear and moist. No oropharyngeal exudate.  Eyes: Conjunctivae are normal. Right eye exhibits no discharge. Left eye exhibits no discharge. No scleral icterus.  Neck: Normal range of motion. Neck supple. No JVD present. No tracheal deviation present. No thyromegaly present.  Cardiovascular: Normal rate, regular rhythm, normal heart sounds and intact distal pulses.  Exam reveals  no gallop and no friction rub.   No murmur heard. Pulmonary/Chest: Effort normal and breath sounds normal. No stridor. No respiratory distress. He has no wheezes. He has no rales. He exhibits no tenderness.  Abdominal: Soft. Bowel sounds are normal. He exhibits no distension and no mass. There is no tenderness. There is no rebound and no guarding.  Musculoskeletal: Normal range of motion. He exhibits no edema, tenderness or deformity.    Lymphadenopathy:    He has no cervical adenopathy.  Neurological: He is oriented to person, place, and time.  Skin: Skin is warm and dry. No rash noted. He is not diaphoretic. No erythema. No pallor.  Psychiatric: He has a normal mood and affect. His behavior is normal. Judgment and thought content normal.  Vitals reviewed.   Lab Results  Component Value Date   WBC 6.9 12/14/2015   HGB 13.4 12/14/2015   HCT 39.5 12/14/2015   PLT 224 12/14/2015   GLUCOSE 166 (H) 02/05/2016   CHOL 147 12/14/2015   TRIG 146 12/14/2015   HDL 30 (L) 12/14/2015   LDLDIRECT 91 08/26/2013   LDLCALC 88 12/14/2015   ALT 29 02/05/2016   AST 26 02/05/2016   NA 138 02/05/2016   K 4.0 02/05/2016   CL 103 02/05/2016   CREATININE 0.89 02/05/2016   BUN 13 02/05/2016   CO2 27 02/05/2016   TSH 1.10 02/05/2016   PSA 1.09 08/26/2013   INR 1.04 12/17/2015   HGBA1C 7.9 (H) 02/05/2016   MICROALBUR 13.46 (H) 08/26/2013    Mr Foot Right Wo Contrast  Result Date: 02/02/2016 CLINICAL DATA:  Right medial ankle pain and swelling for 4 months. Charcot foot. Diabetic neuropathy. EXAM: MRI OF THE RIGHT FOREFOOT WITHOUT CONTRAST TECHNIQUE: Multiplanar, multisequence MR imaging of the ankle was performed. No intravenous contrast was administered. COMPARISON:  01/24/2016 FINDINGS: TENDONS Peroneal: Distal peroneus longus tendinopathy. 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 Posteromedial: Distal tibialis posterior tendinopathy. Anterior: Unremarkable Achilles: Distal Achilles tendinopathy with increased signal and fusiform enlargement near the insertion. Plantar Fascia: Abnormal thickening of the medial band of the plantar fascia proximally. Thickening of the lateral band of the plantar fascia distally. A component of this is likely due to plantar fasciitis although fibromatosis is not excluded. LIGAMENTS Lateral: Unremarkable Medial: Attenuated tibiospring  and tibionavicular portions of deltoid ligament. CARTILAGE Ankle Joint: Unremarkable Subtalar Joints/Sinus Tarsi: Low-level edema in the sinus tarsi without ligamentous disruption. Bones: Extensive midfoot fragmentation, edema, and bony resorption characteristic of Charcot arthropathy. Abnormal edema involves the talus, cuneiform is a, cuboid, and bases of the metatarsals, with fragmentation most especially prominent in the cuneiform is a, and dorsal displacement of fragments. Highly irregular Lisfranc joint with indistinct Lisfranc ligament. There is edema in the extensor digitorum brevis and tracking along the dorsum of the foot. I do not see any definite draining sinus tracks. Other: No supplemental non-categorized findings. IMPRESSION: 1. Extensive midfoot Charcot arthropathy with fragmentation, fracture, and extensive edema involving the bones of the midfoot and metatarsal bases. Surrounding soft tissue edema noted. I do not see any draining sinus tract or obvious abscess to suggest infection, but correlate with clinical signs of infection. 2. Attenuated tibiospring and tibionavicular portions of the deltoid ligament, likely torn. 3. Distal tibialis posterior tendinopathy may reflect tibialis posterior dysfunction. 4. Distal Achilles tendinopathy. 5. Extensive thickening of the medial band of the plantar fascia proximally. There is also some thickening of the lateral band of the  plantar fascia distally. Although probably from plantar fasciitis, a component of plantar fibromatosis is not excluded. 6. 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 Electronically Signed   By: Van Clines M.D.   On: 02/02/2016 15:13    Assessment & Plan:   Kirt was seen today for diarrhea.  Diagnoses and all orders for this visit:  Diarrhea, unspecified type- To try to identify the cause for his diarrhea I have asked him to stop taking metformin for the  time being, I will check his stool for infection with a lactoferrin screening and will also check for C. difficile infection, will screen for pancreatic insufficiency with a pancreatic elastase stool study, will screen for celiac disease, will screen for inflammatory bowel disease with a sedimentation rate, will check for bacterial overgrowth with a CBC/B12/and folate levels. If all of this testing ends up being normal then may consider treating him for IBS with diarrhea. For now he agrees to try over-the-counter Imodium A-D as needed for symptom relief. -     Vitamin B12; Future -     Folate; Future -     Comprehensive metabolic panel; Future -     CBC with Differential/Platelet; Future -     Sedimentation rate; Future -     TSH; Future -     Clostridium difficile EIA; Future -     Fecal lactoferrin, quant; Future -     Giardia/cryptosporidium (EIA); Future -     Gliadin antibodies, serum; Future -     Tissue transglutaminase, IgA; Future -     Reticulin Antibody, IgA w reflex titer; Future -     Pancreatic Elastase, Fecal; Future  Essential hypertension- his blood pressure is well controlled, electrolytes and renal function are stable. -     Comprehensive metabolic panel; Future  Type 2 diabetes mellitus with complication, without long-term current use of insulin ([place])- metformin may be causing the diarrhea so I have asked to hold it for the time being. May consider restarting metformin in the future. Will consider other options to control his blood sugar. -    Comprehensive metabolic panel; Future   I have discontinued Mr. Muniz metFORMIN. I am also having him maintain his ibuprofen, multivitamin with minerals, L-ARGININE PO, glyBURIDE, furosemide, lisinopril, and blood glucose meter kit and supplies.  No orders of the defined types were placed in this encounter.    Follow-up: No Follow-up on file.  Scarlette Calico, MD

## 2016-04-02 ENCOUNTER — Other Ambulatory Visit: Payer: Self-pay | Admitting: Internal Medicine

## 2016-04-02 ENCOUNTER — Other Ambulatory Visit: Payer: 59

## 2016-04-02 DIAGNOSIS — Z23 Encounter for immunization: Secondary | ICD-10-CM

## 2016-04-02 DIAGNOSIS — R197 Diarrhea, unspecified: Secondary | ICD-10-CM

## 2016-04-02 LAB — GLIADIN ANTIBODIES, SERUM
GLIADIN IGG: 2 U (ref <20)
Gliadin IgA: 46 Units — ABNORMAL HIGH (ref <20)

## 2016-04-02 LAB — TISSUE TRANSGLUTAMINASE, IGA: Tissue Transglutaminase Ab, IgA: 1 U/mL (ref <4)

## 2016-04-03 LAB — RETICULIN ANTIBODIES, IGA W TITER: Reticulin Ab, IgA: NEGATIVE

## 2016-04-03 LAB — FECAL LACTOFERRIN, QUANT: Lactoferrin: NOT DETECTED

## 2016-04-09 LAB — GIARDIA/CRYPTOSPORIDIUM (EIA)

## 2016-04-11 ENCOUNTER — Other Ambulatory Visit: Payer: Self-pay | Admitting: Internal Medicine

## 2016-04-11 DIAGNOSIS — K8681 Exocrine pancreatic insufficiency: Secondary | ICD-10-CM

## 2016-04-11 LAB — PANCREATIC ELASTASE, FECAL: Pancreatic Elastase-1, Stool: 161 mcg/g — ABNORMAL LOW

## 2016-04-11 MED ORDER — PANCRELIPASE (LIP-PROT-AMYL) 40000-136000 UNITS PO CPEP: 2.0000 | ORAL_CAPSULE | Freq: Three times a day (TID) | ORAL | 11 refills | Status: DC

## 2016-04-24 ENCOUNTER — Ambulatory Visit (INDEPENDENT_AMBULATORY_CARE_PROVIDER_SITE_OTHER): Payer: 59 | Admitting: Podiatry

## 2016-04-24 ENCOUNTER — Ambulatory Visit (INDEPENDENT_AMBULATORY_CARE_PROVIDER_SITE_OTHER): Payer: 59

## 2016-04-24 ENCOUNTER — Encounter: Payer: Self-pay | Admitting: Podiatry

## 2016-04-24 DIAGNOSIS — E1142 Type 2 diabetes mellitus with diabetic polyneuropathy: Secondary | ICD-10-CM

## 2016-04-24 DIAGNOSIS — M79676 Pain in unspecified toe(s): Secondary | ICD-10-CM | POA: Diagnosis not present

## 2016-04-24 DIAGNOSIS — B351 Tinea unguium: Secondary | ICD-10-CM | POA: Diagnosis not present

## 2016-04-24 DIAGNOSIS — M1460 Charcot's joint, unspecified site: Secondary | ICD-10-CM

## 2016-04-24 NOTE — Progress Notes (Signed)
He presents today to follow-up for his diabetic Charcot arthropathy right foot. He states that the swelling is going down and is not as painful as it was previously. He states that the foot is badly deformed. He also states that his toenails are long and he would like us to trim them.  Objective: Vital signs are stable he is alert and oriented 3. Pulses are palpable. Alignment sensorium is intact. Deep tendon reflexes are intact muscle strength is normal. Deformity to the right foot is becoming more visible as the swelling goes down and radiographs taken today do demonstrate that he appears to be in the third phase of Charcot arthropathy or the consolidation phase. His toenails are also long thick yellow dystrophic onychomycotic no open lesions or wounds are noted bilaterally.  Assessment: Charcot arthropathy right foot. Pain in limb secondary to onychomycosis and diabetic peripheral neuropathy.  Plan: Debridement of toenails 1 through 5 bilateral. I encouraged him to continue to do as little as he has to on this right foot until he consolidates and remodels. I did discuss the possibility of needing a good molded boot or shoe to accommodate his changes. We'll follow-up with him in about 3 months for routine nail debridement he will call sooner if needed.

## 2016-04-28 ENCOUNTER — Ambulatory Visit (INDEPENDENT_AMBULATORY_CARE_PROVIDER_SITE_OTHER): Payer: 59 | Admitting: Internal Medicine

## 2016-04-28 VITALS — BP 140/90 | HR 89 | Temp 98.0°F | Resp 16 | Ht 74.0 in | Wt 300.0 lb

## 2016-04-28 DIAGNOSIS — R1012 Left upper quadrant pain: Secondary | ICD-10-CM | POA: Diagnosis not present

## 2016-04-28 DIAGNOSIS — R197 Diarrhea, unspecified: Secondary | ICD-10-CM | POA: Diagnosis not present

## 2016-04-28 NOTE — Patient Instructions (Addendum)
   Medications reviewed and updated.  No changes recommended at this time.   A Ct scan of the abdomen ordered.

## 2016-04-28 NOTE — Progress Notes (Signed)
Pre visit review using our clinic review tool, if applicable. No additional management support is needed unless otherwise documented below in the visit note. 

## 2016-04-28 NOTE — Progress Notes (Signed)
Subjective:    Patient ID: Randall Morton, male    DOB: 08/04/1970, 45 y.o.   MRN: [id number]  HPI Diarrhea:  He was having diarrhea when he saw Dr Ronnald Ramp last month.  It was intermittent and going on for 6 weeks.  He was tested for pancreatic insufficiency and was prescribed pancreastic enzymes.  He never started taking them due to expense.  He started taking a probiotic and that helped. He has not had diarrhea since starting the probiotic.  He has solid stool once a day.  He denies blood in the stool.  LUQ pain:  He has pain sporatically.  It will be a cramp sensation. The pain can be severe at times.  It comes and goes randomly.  It can feel hard in that area.  There is no obivious relation to BM or food.  The pain started around June - August.  He is concerned because the pain has persisted so long.    Cold symptoms:  Last week he started having cold symptoms.  He has had low grade fever, nasal congestion, sinus pressure, dry cough, headaches and some dizziness.  He has been taking otc cold medications and his symptoms have gotten a little better.  He denies sore throat, ear pain, SOB and wheeze.     Medications and allergies reviewed with patient and updated if appropriate.  Patient Active Problem List   Diagnosis Date Noted  . Exocrine pancreatic insufficiency 04/11/2016  . Diarrhea 04/01/2016  . Obese 02/05/2016  . 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 Low HDL (under 40) 12/15/2015  . Toe osteomyelitis, left ([place]) 07/11/2014  . Type II diabetes mellitus with manifestations ([place]) 08/26/2013  . Neuropathy due to secondary diabetes ([place]) 08/26/2013  . HTN (hypertension) 08/26/2013    Current Outpatient Prescriptions on File Prior to Visit  Medication Sig Dispense Refill  . blood glucose meter kit and supplies KIT Dispense based on patient and insurance preference. Use up to four times daily as directed. (FOR ICD-9 250.00, 250.01). 1 each 0  . furosemide (LASIX) 20 MG tablet Take 1  tablet (20 mg total) by mouth daily. 30 tablet 5  . glyBURIDE (DIABETA) 5 MG tablet Take two tablets with breakfast and one tablet with dinner 90 tablet 5  . ibuprofen (ADVIL,MOTRIN) 800 MG tablet Take 800 mg by mouth every 8 (eight) hours as needed for mild pain.    . L-ARGININE PO Take 1 tablet by mouth daily.    [name] lisinopril (PRINIVIL,ZESTRIL) 10 MG tablet Take 1 tablet (10 mg total) by mouth daily. 30 tablet 5  . Multiple Vitamins-Minerals (MULTIVITAMIN WITH MINERALS) tablet Take 1 tablet by mouth daily.    . Pancrelipase, Lip-Prot-Amyl, (ZENPEP) 40000 units CPEP Take 2 capsules by mouth 3 (three) times daily with meals. (Patient not taking: Reported on 04/28/2016) 180 capsule 11   No current facility-administered medications on file prior to visit.     Past Medical History:  Diagnosis Date  . Diabetes mellitus without complication ([place])   . 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 Diabetic retinopathy ([place])    last checked on 04/2014  . Hypertension   . Osteomyelitis ([place])    left foot ulcer--> osteo  . Polyneuropathy in diabetes [facility])     Past Surgical History:  Procedure Laterality Date  . CARDIAC CATHETERIZATION N/A 12/17/2015   Procedure: Left Heart Cath and Coronary Angiography;  Surgeon: Josue Hector, MD;  Location: [facility]  CV LAB;  Service: Cardiovascular;  Laterality: N/A;    Social History   Social History  . Marital status: Single    Spouse name: N/A  . Number of children: N/A  . Years of education: N/A   Social History Main Topics  . Smoking status: Never Smoker  . Smokeless tobacco: Not on file  . Alcohol use 0.0 oz/week  . Drug use: No  . Sexual activity: Not on file   Other Topics Concern  . Not on file   Social History Narrative  . No narrative on file    Family History  Problem Relation Age of Onset  . Asthma Mother   . Diabetes Father   . Heart disease Father   . Hyperlipidemia Father   . Hypertension Father   . Stroke  Father   . Prostate cancer Paternal Grandfather     Review of Systems  Constitutional: Positive for fever (100). Negative for appetite change and chills.  HENT: Positive for congestion and sinus pressure. Negative for ear pain and sore throat.   Respiratory: Positive for cough. Negative for shortness of breath and wheezing.   Gastrointestinal: Negative for blood in stool and nausea.  Genitourinary: Negative for dysuria and hematuria.  Neurological: Positive for dizziness and headaches.       Objective:   Vitals:   04/28/16 1635  BP: 140/90  Pulse: 89  Resp: 16  Temp: 98 F (36.7 C)   Filed Weights   04/28/16 1635  Weight: 300 lb (136.1 kg)   Body mass index is 38.52 kg/m.   Physical Exam  Constitutional: He appears well-developed and well-nourished. No distress.  HENT:  Head: Normocephalic and atraumatic.  Right Ear: External ear normal.  Left Ear: External ear normal.  Mouth/Throat: Oropharynx is clear and moist. No oropharyngeal exudate.  B/L ear canals and TM normal  Eyes: Conjunctivae are normal.  Neck: Neck supple. No tracheal deviation present. No thyromegaly present.  Cardiovascular: Normal rate, regular rhythm and normal heart sounds.   Pulmonary/Chest: Effort normal and breath sounds normal. No respiratory distress. He has no wheezes. He has no rales.  Abdominal: Soft. He exhibits no distension and no mass. There is tenderness (LUQ only). There is no rebound and no guarding.  Musculoskeletal: He exhibits no edema.  Lymphadenopathy:    He has no cervical adenopathy.  Skin: Skin is warm and dry. He is not diaphoretic.          Assessment & Plan:   See Problem List for Assessment and Plan of chronic medical problems.

## 2016-04-29 ENCOUNTER — Ambulatory Visit (INDEPENDENT_AMBULATORY_CARE_PROVIDER_SITE_OTHER)
Admission: RE | Admit: 2016-04-29 | Discharge: 2016-04-29 | Disposition: A | Discharge status: Home / Self Care | Payer: 59 | Source: Ambulatory Visit | Attending: Internal Medicine | Admitting: Internal Medicine

## 2016-04-29 DIAGNOSIS — R1012 Left upper quadrant pain: Secondary | ICD-10-CM | POA: Diagnosis not present

## 2016-04-29 MED ORDER — IOPAMIDOL (ISOVUE-300) INJECTION 61%
100.0000 mL | Freq: Once | INTRAVENOUS | Status: AC | PRN
  Administered 2016-04-29: 100 mL via INTRAVENOUS

## 2016-04-30 ENCOUNTER — Encounter: Payer: Self-pay | Admitting: Internal Medicine

## 2016-04-30 DIAGNOSIS — R1012 Left upper quadrant pain: Secondary | ICD-10-CM | POA: Insufficient documentation

## 2016-04-30 MED ORDER — SACCHAROMYCES BOULARDII 250 MG PO CAPS: 250.0000 mg | ORAL_CAPSULE | Freq: Two times a day (BID) | ORAL | Status: DC

## 2016-04-30 NOTE — Assessment & Plan Note (Signed)
Was having intermittent diarrhea x 6 weeks Resolved with starting a probiotic Continue probiotic

## 2016-04-30 NOTE — Assessment & Plan Note (Signed)
Persistent for months - severe at times Will do a Ct scan to rule out pancreatic mass ? Chronic pancreatitis ? Colon pathology Depending on above results will refer to GI for further evaluation

## 2016-05-07 ENCOUNTER — Ambulatory Visit: Payer: 59 | Admitting: Internal Medicine

## 2016-05-12 ENCOUNTER — Telehealth: Payer: Self-pay | Admitting: Internal Medicine

## 2016-05-12 DIAGNOSIS — R109 Unspecified abdominal pain: Secondary | ICD-10-CM

## 2016-05-12 NOTE — Telephone Encounter (Signed)
Pt wife called in and would like a referral to GI and would like nurse to call her because he is still crapping and hurting and needs to know what to do?

## 2016-05-13 NOTE — Telephone Encounter (Signed)
Please advise, should say cramping and hurting.

## 2016-05-14 MED ORDER — HYOSCYAMINE SULFATE 0.125 MG SL SUBL: 0.1250 mg | SUBLINGUAL_TABLET | SUBLINGUAL | 0 refills | Status: DC | PRN

## 2016-05-14 NOTE — Telephone Encounter (Signed)
Referral ordered.  I sent a prescription to his pharmacy to try for the cramping.  Have him let us know if it helps.

## 2016-05-14 NOTE — Telephone Encounter (Signed)
Spoke with pt's wife to inform.  

## 2016-05-23 ENCOUNTER — Encounter: Payer: Self-pay | Admitting: Gastroenterology

## 2016-06-13 ENCOUNTER — Ambulatory Visit: Payer: 59 | Admitting: Family

## 2016-06-14 ENCOUNTER — Ambulatory Visit: Payer: 59 | Admitting: Family Medicine

## 2016-06-14 ENCOUNTER — Ambulatory Visit (INDEPENDENT_AMBULATORY_CARE_PROVIDER_SITE_OTHER): Payer: 59 | Admitting: Family Medicine

## 2016-06-14 ENCOUNTER — Encounter: Payer: Self-pay | Admitting: Family Medicine

## 2016-06-14 VITALS — BP 128/84 | HR 89 | Temp 98.5°F | Wt 308.0 lb

## 2016-06-14 DIAGNOSIS — J329 Chronic sinusitis, unspecified: Secondary | ICD-10-CM

## 2016-06-14 DIAGNOSIS — B9689 Other specified bacterial agents as the cause of diseases classified elsewhere: Secondary | ICD-10-CM

## 2016-06-14 MED ORDER — AMOXICILLIN-POT CLAVULANATE 875-125 MG PO TABS: 1.0000 | ORAL_TABLET | Freq: Two times a day (BID) | ORAL | 0 refills | Status: DC

## 2016-06-14 NOTE — Patient Instructions (Signed)
Sinsusitis Bacterial based on: Symptoms >10 days  Treatment: -considered steroid: we opted opted out with diabetes -other symptomatic care with advil pm reasonable -Antibiotic indicated: yes  Finally, we reviewed reasons to return to care including if symptoms worsen or persist or new concerns arise (particularly fever or shortness of breath)  Meds ordered this encounter  Medications  . amoxicillin-clavulanate (AUGMENTIN) 875-125 MG tablet    Sig: Take 1 tablet by mouth 2 (two) times daily.    Dispense:  14 tablet    Refill:  0

## 2016-06-14 NOTE — Progress Notes (Signed)
PCP: Binnie Rail, MD  Subjective:  Randall Morton is a 45 y.o. year old very pleasant male patient who presents with sinusitis symptoms including nasal congestion, sinus tenderness, yellow/green discharge mixed with some clear -other symptoms include: coughing up yellow/green sputum -day of illness:13 -Symptoms are improving but only slightly -previous treatments: advil PM helped -sick contacts/travel/risks: on a cruise just before this- several sick people around him  ROS-denies fever, SOB, NVD, tooth pain  Pertinent Past Medical History-  Patient Active Problem List   Diagnosis Date Noted  . LUQ pain 04/30/2016  . Exocrine pancreatic insufficiency 04/11/2016  . Diarrhea 04/01/2016  . Obese 02/05/2016  . 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 Low HDL (under 40) 12/15/2015  . Toe osteomyelitis, left ([place]) 07/11/2014  . Type II diabetes mellitus with manifestations ([place]) 08/26/2013  . Neuropathy due to secondary diabetes ([place]) 08/26/2013  . HTN (hypertension) 08/26/2013    Medications- reviewed  Current Outpatient Prescriptions  Medication Sig Dispense Refill  . blood glucose meter kit and supplies KIT Dispense based on patient and insurance preference. Use up to four times daily as directed. (FOR ICD-9 250.00, 250.01). 1 each 0  . furosemide (LASIX) 20 MG tablet Take 1 tablet (20 mg total) by mouth daily. 30 tablet 5  . glyBURIDE (DIABETA) 5 MG tablet Take two tablets with breakfast and one tablet with dinner 90 tablet 5  . hyoscyamine (LEVSIN SL) 0.125 MG SL tablet Place 1 tablet (0.125 mg total) under the tongue every 4 (four) hours as needed for cramping. 30 tablet 0  . ibuprofen (ADVIL,MOTRIN) 800 MG tablet Take 800 mg by mouth every 8 (eight) hours as needed for mild pain.    . L-ARGININE PO Take 1 tablet by mouth daily.    [name] lisinopril (PRINIVIL,ZESTRIL) 10 MG tablet Take 1 tablet (10 mg total) by mouth daily. 30 tablet 5  . Multiple Vitamins-Minerals (MULTIVITAMIN WITH  MINERALS) tablet Take 1 tablet by mouth daily.    [name] saccharomyces boulardii (FLORASTOR) 250 MG capsule Take 1 capsule (250 mg total) by mouth 2 (two) times daily.    [name] amoxicillin-clavulanate (AUGMENTIN) 875-125 MG tablet Take 1 tablet by mouth 2 (two) times daily. 14 tablet 0   No current facility-administered medications for this visit.     Objective: BP 128/84   Pulse 89   Temp 98.5 F (36.9 C) (Oral)   Wt (!) 308 lb (139.7 kg)   SpO2 98%   BMI 39.54 kg/m  Gen: NAD, resting comfortably HEENT: Turbinates erythematous and narrow with yellow drainage, TM normal, pharynx mildly erythematous with no tonsilar exudate or edema, mild maxillary sinus tenderness CV: RRR no murmurs rubs or gallops Lungs: CTAB no crackles, wheeze, rhonchi Abdomen: obese.  Skin: warm, dry, no rash  Assessment/Plan:  Sinsusitis Bacterial based on: Symptoms >10 days  Treatment: -considered steroid: we opted opted out with diabetes -other symptomatic care with advil pm reasonable -Antibiotic indicated: yes  Finally, we reviewed reasons to return to care including if symptoms worsen or persist or new concerns arise (particularly fever or shortness of breath)  Meds ordered this encounter  Medications  . amoxicillin-clavulanate (AUGMENTIN) 875-125 MG tablet    Sig: Take 1 tablet by mouth 2 (two) times daily.    Dispense:  14 tablet    Refill:  0    Garret Reddish, MD

## 2016-06-20 ENCOUNTER — Telehealth: Payer: Self-pay | Admitting: Internal Medicine

## 2016-06-20 NOTE — Telephone Encounter (Signed)
Pt wife called in and said that the meds that was given to him for his sculp is not working.  It is still really red and inflamed.  Pt seen Randall Morton and Randall Morton had told if it didn't work to call back in  [facility] number [phone]

## 2016-06-22 NOTE — Telephone Encounter (Signed)
What was he given for his scalp?  He saw Dr Durene CalHunter for a sinus infection.

## 2016-06-24 ENCOUNTER — Encounter: Payer: Self-pay | Admitting: Podiatry

## 2016-06-24 ENCOUNTER — Ambulatory Visit (INDEPENDENT_AMBULATORY_CARE_PROVIDER_SITE_OTHER): Payer: 59 | Admitting: Podiatry

## 2016-06-24 ENCOUNTER — Ambulatory Visit (INDEPENDENT_AMBULATORY_CARE_PROVIDER_SITE_OTHER): Payer: 59

## 2016-06-24 DIAGNOSIS — M79676 Pain in unspecified toe(s): Secondary | ICD-10-CM

## 2016-06-24 DIAGNOSIS — E1142 Type 2 diabetes mellitus with diabetic polyneuropathy: Secondary | ICD-10-CM | POA: Diagnosis not present

## 2016-06-24 DIAGNOSIS — B351 Tinea unguium: Secondary | ICD-10-CM

## 2016-06-24 DIAGNOSIS — M1460 Charcot's joint, unspecified site: Secondary | ICD-10-CM | POA: Diagnosis not present

## 2016-06-24 NOTE — Telephone Encounter (Signed)
Spoke with pts sister, she is unsure about the medication. She will have pt call back.

## 2016-06-24 NOTE — Telephone Encounter (Signed)
I am not sure what the problem is -- needs to be seen by us or derm.

## 2016-06-24 NOTE — Telephone Encounter (Signed)
LVM for Lisa to call back

## 2016-06-24 NOTE — Telephone Encounter (Signed)
Pt called in said that he needs meds for his scalp. Doesn't look like he was giving any meds for his scalp just sinus infection.  [facility] but I did not see it noted.?

## 2016-06-24 NOTE — Telephone Encounter (Signed)
Please advise. Does pt need an appt?

## 2016-06-25 NOTE — Telephone Encounter (Signed)
LVM informing pt an appt Is needed.

## 2016-06-25 NOTE — Progress Notes (Signed)
He presents today for a low-grade ache in his right foot. He has a history of Charcot deformity and states that his diabetes is under decent control. Elected to have his nails cut.  Objective: Vital signs are stable he is alert and oriented 3. No open lesions or wounds are noted. Pulses remain palpable. Radiographs taken today do demonstrate an increase in destruction of the midfoot with some areas of consolidation. His toenails are long thick yellow dystrophic onychomycotic.  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 Pain in limb secondary to onychomycosis.  Plan: Debridement of toenails 1 through 5 bilateral. Follow up with him in about 3 months

## 2016-06-27 ENCOUNTER — Ambulatory Visit: Payer: 59 | Admitting: Internal Medicine

## 2016-07-01 ENCOUNTER — Ambulatory Visit: Payer: 59 | Admitting: Internal Medicine

## 2016-07-10 ENCOUNTER — Ambulatory Visit: Payer: 59 | Admitting: Gastroenterology

## 2016-07-24 ENCOUNTER — Encounter: Payer: Self-pay | Admitting: Gastroenterology

## 2016-07-24 ENCOUNTER — Ambulatory Visit (INDEPENDENT_AMBULATORY_CARE_PROVIDER_SITE_OTHER): Payer: 59 | Admitting: Gastroenterology

## 2016-07-24 ENCOUNTER — Ambulatory Visit: Payer: 59 | Admitting: Podiatry

## 2016-07-24 ENCOUNTER — Encounter (INDEPENDENT_AMBULATORY_CARE_PROVIDER_SITE_OTHER): Payer: Self-pay

## 2016-07-24 VITALS — BP 124/90 | HR 88 | Ht 73.6 in | Wt 312.2 lb

## 2016-07-24 DIAGNOSIS — R197 Diarrhea, unspecified: Secondary | ICD-10-CM | POA: Diagnosis not present

## 2016-07-24 DIAGNOSIS — E118 Type 2 diabetes mellitus with unspecified complications: Secondary | ICD-10-CM

## 2016-07-24 DIAGNOSIS — R1012 Left upper quadrant pain: Secondary | ICD-10-CM

## 2016-07-24 DIAGNOSIS — K8681 Exocrine pancreatic insufficiency: Secondary | ICD-10-CM | POA: Diagnosis not present

## 2016-07-24 NOTE — Progress Notes (Signed)
[facility] Consult Note:  History: Randall Morton 07/24/2016  Referring physician: [name], MD  Reason for consult/chief complaint: Abdominal Pain (generalized X 1-2 months - but hasnt had since Holidays.  No colonoscopy or endoscopy in the past.)   Subjective  HPI:  This is a 46-year-old man referred by Drs. Jones and Burns of primary care for abdominal pain and diarrhea. He saw them in early October for about a month of frequent epigastric to left upper quadrant abdominal pain that was nonradiating but crampy and bloating. He also was having several loose stools per day with no bleeding. He had no clear triggers such as recent travel, antibiotic use or sick contacts. Extensive lab workup and stool studies were done, and his fecal elastase was low at 160. He was then prescribed pancreatic enzymes, but he did not take them due to expense. He says he was eating a very heavy diet including a lot of nuts, and he made a significant change in that. He also stopped using a certain protein supplement he bought on line. After another few weeks, his symptoms completely resolved and he has felt well since then. He currently denies abdominal pain, diarrhea, nausea, vomiting, dysphagia, early satiety or weight loss. His bowel habits are regular and he denies rectal bleeding.   ROS:  Review of Systems  Constitutional: Negative for appetite change and unexpected weight change.  HENT: Negative for mouth sores and voice change.   Eyes: Negative for pain and redness.  Respiratory: Negative for cough and shortness of breath.   Cardiovascular: Negative for chest pain and palpitations.  Genitourinary: Negative for dysuria and hematuria.  Musculoskeletal: Negative for arthralgias and myalgias.  Skin: Negative for pallor and rash.  Neurological: Negative for weakness and headaches.  Hematological: Negative for adenopathy.     Past Medical History: Past Medical History:  Diagnosis Date  .  Diabetes mellitus without complication ([facility])   . Diabetic Charct's arthropathy ([facility])    right foot  . Diabetic retinopathy ([facility])    last checked on 04/2014  . Hypertension   . Osteomyelitis ([facility])    left foot ulcer--> osteo  . Polyneuropathy in diabetes ([facility])      Past Surgical History: Past Surgical History:  Procedure Laterality Date  . CARDIAC CATHETERIZATION N/A 12/17/2015   Procedure: Left Heart Cath and Coronary Angiography;  Surgeon: Peter C Nishan, MD;  Location: [facility];  Service: Cardiovascular;  Laterality: N/A;     Family History: Family History  Problem Relation Age of Onset  . Asthma Mother   . Diabetes Father   . Heart disease Father   . Hyperlipidemia Father   . Hypertension Father   . Stroke Father   . Prostate cancer Paternal Grandfather   . Colon cancer Neg Hx   . Rectal cancer Neg Hx   . Stomach cancer Neg Hx   . Esophageal cancer Neg Hx   . Liver cancer Neg Hx     Social History: Social History   Social History  . Marital status: Single    Spouse name: N/A  . Number of children: 2  . Years of education: N/A   Occupational History  . Sheet Metal Mechanic    Social History Main Topics  . Smoking status: Never Smoker  . Smokeless tobacco: Never Used  . Alcohol use 0.0 oz/week     Comment: socially  . Drug use: No  . Sexual activity: Not Asked   Other Topics Concern  .   None   Social History Narrative  . None    Allergies: No Known Allergies  Outpatient Meds: Current Outpatient Prescriptions  Medication Sig Dispense Refill  . blood glucose meter kit and supplies KIT Dispense based on patient and insurance preference. Use up to four times daily as directed. (FOR ICD-9 250.00, 250.01). 1 each 0  . Doxycycline Hyclate 120 MG TBEC Take by mouth 2 (two) times daily.    . furosemide (LASIX) 20 MG tablet Take 1 tablet (20 mg total) by mouth daily. 30 tablet 5  . glyBURIDE (DIABETA) 5 MG tablet Take two tablets with breakfast  and one tablet with dinner 90 tablet 5  . ibuprofen (ADVIL,MOTRIN) 800 MG tablet Take 800 mg by mouth every 8 (eight) hours as needed for mild pain.    . L-ARGININE PO Take 1 tablet by mouth as needed.     . lisinopril (PRINIVIL,ZESTRIL) 10 MG tablet Take 1 tablet (10 mg total) by mouth daily. 30 tablet 5  . Multiple Vitamins-Minerals (MULTIVITAMIN WITH MINERALS) tablet Take 1 tablet by mouth daily.     No current facility-administered medications for this visit.       ___________________________________________________________________ Objective   Exam:  BP 124/90   Pulse 88   Ht 6' 1.6" (1.869 m)   Wt (!) 312 lb 4 oz (141.6 kg)   BMI 40.53 kg/m    General: this is a(n) Obese and otherwise well-appearing man with good muscle mass   Eyes: sclera anicteric, no redness  ENT: oral mucosa moist without lesions, no cervical or supraclavicular lymphadenopathy, good dentition  CV: RRR without murmur, S1/S2, no JVD, no peripheral edema  Resp: clear to auscultation bilaterally, normal RR and effort noted  GI: soft, no tenderness, with active bowel sounds. No guarding or palpable organomegaly noted.  Skin; warm and dry, no rash or jaundice noted  Neuro: awake, alert and oriented x 3. Normal gross motor function and fluent speech  Labs:  Normal CBC, CMP and TSH. Celiac TTG antibody normal. Elevated gliadin IgA antibody, but that is nonspecific. ESR 26 Fecal elastase 160 (normal is greater than 200) Stool negative for infectious agents.  Radiologic Studies:  CT abdomen and pelvis with contrast in early November was normal, including the pancreas.  Assessment: Encounter Diagnoses  Name Primary?  . Diarrhea, unspecified type Yes  . LUQ pain   . Exocrine pancreatic insufficiency   . Type 2 diabetes mellitus with complication, without long-term current use of insulin ([facility])     The source of his symptoms at that time is unclear. If he truly was pancreatic insufficient, it  is not severe by lab criteria. His change in diet may been enough to control the symptoms. He certainly has no changes of chronic pancreatitis on CT scan, he has never been a regular alcohol user or smoker. It may be that this finding was nonspecific, and the patient had dietary indiscretions, some other acute illness that finally resolved or some component in this protein supplement that did not agree with him. At any rate, he is well right now and I don't think any further testing is necessary.  Plan:  He should see me at age 48 for screening colonoscopy and certainly sooner as needed.  Thank you for the courtesy of this consult.  Please call me with any questions or concerns.  Render Marley L Danis III  CC: [name], MD  

## 2016-07-24 NOTE — Patient Instructions (Signed)
If you are age 48 or older, your body mass index should be between 23-30. Your Body mass index is 40.53 kg/m. If this is out of the aforementioned range listed, please consider follow up with your Primary Care Provider.  If you are age 48 or younger, your body mass index should be between 19-25. Your Body mass index is 40.53 kg/m. If this is out of the aformentioned range listed, please consider follow up with your Primary Care Provider.   Follow up as needed.  Thank you for choosing [facility]  Dr Amada JupiterHenry Danis III

## 2016-08-05 ENCOUNTER — Other Ambulatory Visit (INDEPENDENT_AMBULATORY_CARE_PROVIDER_SITE_OTHER): Payer: 59

## 2016-08-05 ENCOUNTER — Ambulatory Visit (INDEPENDENT_AMBULATORY_CARE_PROVIDER_SITE_OTHER): Payer: 59 | Admitting: Internal Medicine

## 2016-08-05 ENCOUNTER — Encounter: Payer: Self-pay | Admitting: Internal Medicine

## 2016-08-05 VITALS — BP 144/76 | HR 109 | Temp 98.5°F | Resp 16 | Wt 310.0 lb

## 2016-08-05 DIAGNOSIS — R21 Rash and other nonspecific skin eruption: Secondary | ICD-10-CM

## 2016-08-05 DIAGNOSIS — R600 Localized edema: Secondary | ICD-10-CM | POA: Insufficient documentation

## 2016-08-05 DIAGNOSIS — E1340 Other specified diabetes mellitus with diabetic neuropathy, unspecified: Secondary | ICD-10-CM

## 2016-08-05 DIAGNOSIS — E118 Type 2 diabetes mellitus with unspecified complications: Secondary | ICD-10-CM

## 2016-08-05 DIAGNOSIS — I10 Essential (primary) hypertension: Secondary | ICD-10-CM

## 2016-08-05 LAB — COMPREHENSIVE METABOLIC PANEL
ALK PHOS: 77 U/L (ref 39–117)
ALT: 19 U/L (ref 0–53)
AST: 23 U/L (ref 0–37)
Albumin: 4.1 g/dL (ref 3.5–5.2)
BILIRUBIN TOTAL: 0.5 mg/dL (ref 0.2–1.2)
BUN: 13 mg/dL (ref 6–23)
CO2: 31 mEq/L (ref 19–32)
Calcium: 9.5 mg/dL (ref 8.4–10.5)
Chloride: 104 mEq/L (ref 96–112)
Creatinine, Ser: 0.9 mg/dL (ref 0.40–1.50)
GFR: 115.87 mL/min (ref >60.00)
GLUCOSE: 112 mg/dL — AB (ref 70–99)
Potassium: 4.3 mEq/L (ref 3.5–5.1)
Sodium: 139 mEq/L (ref 135–145)
TOTAL PROTEIN: 8.1 g/dL (ref 6.0–8.3)

## 2016-08-05 LAB — HEMOGLOBIN A1C: Hgb A1c MFr Bld: 6.9 % — ABNORMAL HIGH (ref 4.6–6.5)

## 2016-08-05 MED ORDER — GLYBURIDE 5 MG PO TABS: 5.0000 mg | ORAL_TABLET | Freq: Two times a day (BID) | ORAL | 5 refills | Status: DC

## 2016-08-05 NOTE — Progress Notes (Signed)
Subjective:    Patient ID: Randall Morton, male    DOB: 1971/04/03, 46 y.o.   MRN: [id number]  HPI The patient is here for follow up.  Diabetes: He is taking his medication daily as prescribed. He is compliant with a low carb diet, but is still struggling with the sugars. He is exercising regularly. He has not been monitoring his sugars.  He checks his feet daily and denies foot lesions. He is up-to-date with an ophthalmology examination.   Hypertension: He is taking his medication daily. He is compliant with a low sodium diet.  He denies chest pain, palpitations, shortness of breath and regular headaches. He is exercising regularly.  He does not monitor his blood pressure at home.     Medications and allergies reviewed with patient and updated if appropriate.  Patient Active Problem List   Diagnosis Date Noted  . LUQ pain 04/30/2016  . Diarrhea 04/01/2016  . Obese 02/05/2016  . 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 Low HDL (under 40) 12/15/2015  . Toe osteomyelitis, left ([place]) 07/11/2014  . Type II diabetes mellitus with manifestations ([place]) 08/26/2013  . Neuropathy due to secondary diabetes ([place]) 08/26/2013  . HTN (hypertension) 08/26/2013    Current Outpatient Prescriptions on File Prior to Visit  Medication Sig Dispense Refill  . blood glucose meter kit and supplies KIT Dispense based on patient and insurance preference. Use up to four times daily as directed. (FOR ICD-9 250.00, 250.01). 1 each 0  . furosemide (LASIX) 20 MG tablet Take 1 tablet (20 mg total) by mouth daily. 30 tablet 5  . glyBURIDE (DIABETA) 5 MG tablet Take two tablets with breakfast and one tablet with dinner 90 tablet 5  . ibuprofen (ADVIL,MOTRIN) 800 MG tablet Take 800 mg by mouth every 8 (eight) hours as needed for mild pain.    . L-ARGININE PO Take 1 tablet by mouth as needed.     [name] lisinopril (PRINIVIL,ZESTRIL) 10 MG tablet Take 1 tablet (10 mg total) by mouth daily. 30 tablet 5  . Multiple  Vitamins-Minerals (MULTIVITAMIN WITH MINERALS) tablet Take 1 tablet by mouth daily.     No current facility-administered medications on file prior to visit.     Past Medical History:  Diagnosis Date  . Diabetes mellitus without complication ([place])   . 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 Diabetic retinopathy ([place])    last checked on 04/2014  . Hypertension   . Osteomyelitis ([place])    left foot ulcer--> osteo  . Polyneuropathy in diabetes [facility])     Past Surgical History:  Procedure Laterality Date  . CARDIAC CATHETERIZATION N/A 12/17/2015   Procedure: Left Heart Cath and Coronary Angiography;  Surgeon: Josue Hector, MD;  Location: [place] [facility];  Service: Cardiovascular;  Laterality: N/A;    Social History   Social History  . Marital status: Single    Spouse name: N/A  . Number of children: 2  . Years of education: N/A   Occupational History  . Sheet Metal Mechanic    Social History Main Topics  . Smoking status: Never Smoker  . Smokeless tobacco: Never Used  . Alcohol use 0.0 oz/week     Comment: socially  . Drug use: No  . Sexual activity: Not on file   Other Topics Concern  . Not on file   Social History Narrative  . No narrative on file    Family History  Problem Relation Age  of Onset  . Asthma Mother   . Diabetes Father   . Heart disease Father   . Hyperlipidemia Father   . Hypertension Father   . Stroke Father   . Prostate cancer Paternal Grandfather   . Colon cancer Neg Hx   . Rectal cancer Neg Hx   . Stomach cancer Neg Hx   . Esophageal cancer Neg Hx   . Liver cancer Neg Hx     Review of Systems  Constitutional: Negative for fever.  Respiratory: Negative for cough, shortness of breath and wheezing.   Cardiovascular: Positive for leg swelling (right foot only). Negative for chest pain and palpitations.  Neurological: Negative for light-headedness and headaches.       Objective:   Vitals:   08/05/16 1541  BP: (!)  144/76  Pulse: (!) 109  Resp: 16  Temp: 98.5 F (36.9 C)   Wt Readings from Last 3 Encounters:  08/05/16 (!) 310 lb (140.6 kg)  07/24/16 (!) 312 lb 4 oz (141.6 kg)  06/14/16 (!) 308 lb (139.7 kg)   Body mass index is 40.24 kg/m.   Physical Exam    Constitutional: Appears well-developed and well-nourished. No distress.  HENT:  Head: Normocephalic and atraumatic.  Neck: Neck supple. No tracheal deviation present. No thyromegaly present.  No cervical lymphadenopathy Cardiovascular: Normal rate, regular rhythm and normal heart sounds.   No murmur heard. No carotid bruit .  Trace RLE edema Pulmonary/Chest: Effort normal and breath sounds normal. No respiratory distress. No has no wheezes. No rales.  Skin: Skin is warm and dry. Not diaphoretic.  Psychiatric: Normal mood and affect. Behavior is normal.      Assessment & Plan:    See Problem List for Assessment and Plan of chronic medical problems.

## 2016-08-05 NOTE — Assessment & Plan Note (Signed)
Seeing Dr Al CorpusHyatt He has made good lifestyle changes - will get DM better controlled - will likely need second medication Continue follow up with podiatry

## 2016-08-05 NOTE — Assessment & Plan Note (Signed)
Has seen some improvement Taking doxycycline

## 2016-08-05 NOTE — Assessment & Plan Note (Addendum)
Check a1c Low sugar / carb diet Stressed regular exercise, weight loss Consider adding second medication depending on a1c

## 2016-08-05 NOTE — Assessment & Plan Note (Signed)
BP elevated today - related to stress continue current medications at current doses cmp

## 2016-08-05 NOTE — Assessment & Plan Note (Signed)
Edema controlled with lasix

## 2016-08-05 NOTE — Progress Notes (Signed)
Pre visit review using our clinic review tool, if applicable. No additional management support is needed unless otherwise documented below in the visit note. 

## 2016-08-05 NOTE — Patient Instructions (Signed)
  Test(s) ordered today. Your results will be released to MyChart (or called to you) after review, usually within 72hours after test completion. If any changes need to be made, you will be notified at that same time.   No immunizations administered today.   Medications reviewed and updated.  No changes recommended at this time.   Please followup in 3 month

## 2016-09-16 ENCOUNTER — Ambulatory Visit (INDEPENDENT_AMBULATORY_CARE_PROVIDER_SITE_OTHER): Payer: 59 | Admitting: Nurse Practitioner

## 2016-09-16 ENCOUNTER — Encounter: Payer: Self-pay | Admitting: Nurse Practitioner

## 2016-09-16 VITALS — BP 156/110 | HR 92 | Temp 97.5°F | Ht 74.0 in | Wt 313.0 lb

## 2016-09-16 DIAGNOSIS — M25511 Pain in right shoulder: Secondary | ICD-10-CM | POA: Diagnosis not present

## 2016-09-16 MED ORDER — NAPROXEN-ESOMEPRAZOLE 500-20 MG PO TBEC: 1.0000 | DELAYED_RELEASE_TABLET | Freq: Two times a day (BID) | ORAL | 1 refills | Status: DC | PRN

## 2016-09-16 MED ORDER — DICLOFENAC SODIUM 2 % TD SOLN: 1.0000 "application " | Freq: Two times a day (BID) | TRANSDERMAL | 1 refills | Status: DC | PRN

## 2016-09-16 NOTE — Patient Instructions (Addendum)
Stop heavy weight lifting for at least 2weeks.  Do shoulder exercises every day.(exercise sheet provided). Apply cold compress after exercise.

## 2016-09-16 NOTE — Progress Notes (Signed)
Pre visit review using our clinic review tool, if applicable. No additional management support is needed unless otherwise documented below in the visit note. 

## 2016-09-16 NOTE — Progress Notes (Signed)
Subjective:  Patient ID: Randall Morton, male    DOB: 1970-12-11  Age: 46 y.o. MRN: [id number]  CC: Pain (shoulders pain for 1 wk. working out)   Shoulder Pain   The pain is present in the right shoulder. This is a new problem. The current episode started 1 to 4 weeks ago. There has been no history of extremity trauma. The quality of the pain is described as aching. Pertinent negatives include no fever, inability to bear weight, itching, joint locking, joint swelling, limited range of motion, numbness, stiffness or tingling. The symptoms are aggravated by activity. He has tried NSAIDS for the symptoms. The treatment provided mild relief. Family history does not include gout or rheumatoid arthritis. There is no history of diabetes, gout or osteoarthritis.  onset after lifting 150lbs x 4reps.  Outpatient Medications Prior to Visit  Medication Sig Dispense Refill  . blood glucose meter kit and supplies KIT Dispense based on patient and insurance preference. Use up to four times daily as directed. (FOR ICD-9 250.00, 250.01). 1 each 0  . doxycycline (VIBRAMYCIN) 100 MG capsule Take 100 mg by mouth 2 (two) times daily.    . furosemide (LASIX) 20 MG tablet Take 1 tablet (20 mg total) by mouth daily. 30 tablet 5  . glyBURIDE (DIABETA) 5 MG tablet Take 1 tablet (5 mg total) by mouth 2 (two) times daily with a meal. Take two tablets with breakfast and one tablet with dinner 60 tablet 5  . L-ARGININE PO Take 1 tablet by mouth as needed.     [name] lisinopril (PRINIVIL,ZESTRIL) 10 MG tablet Take 1 tablet (10 mg total) by mouth daily. 30 tablet 5  . Multiple Vitamins-Minerals (MULTIVITAMIN WITH MINERALS) tablet Take 1 tablet by mouth daily.    [name] ibuprofen (ADVIL,MOTRIN) 800 MG tablet Take 800 mg by mouth every 8 (eight) hours as needed for mild pain.     No facility-administered medications prior to visit.     ROS See HPI  Objective:  BP (!) 156/110   Pulse 92   Temp 97.5 F (36.4 C)   Ht 6' 2"  (1.88 m)    Wt (!) 313 lb (142 kg)   SpO2 99%   BMI 40.19 kg/m   BP Readings from Last 3 Encounters:  09/16/16 (!) 156/110  08/05/16 (!) 144/76  07/24/16 124/90    Wt Readings from Last 3 Encounters:  09/16/16 (!) 313 lb (142 kg)  08/05/16 (!) 310 lb (140.6 kg)  07/24/16 (!) 312 lb 4 oz (141.6 kg)    Physical Exam  Constitutional: He is oriented to person, place, and time.  Cardiovascular: Normal rate.   Pulmonary/Chest: Effort normal.  Musculoskeletal: He exhibits tenderness. He exhibits no edema.       Right shoulder: He exhibits tenderness. He exhibits normal range of motion, no bony tenderness, no swelling, no effusion, no crepitus, no laceration, no pain, no spasm, normal pulse and normal strength.  Anterior shoulder pain.  Neurological: He is alert and oriented to person, place, and time.  Skin: Skin is warm and dry.  Vitals reviewed.   Lab Results  Component Value Date   WBC 8.4 04/01/2016   HGB 15.1 04/01/2016   HCT 43.8 04/01/2016   PLT 253.0 04/01/2016   GLUCOSE 112 (H) 08/05/2016   CHOL 147 12/14/2015   TRIG 146 12/14/2015   HDL 30 (L) 12/14/2015   LDLDIRECT 91 08/26/2013   LDLCALC 88 12/14/2015   ALT 19 08/05/2016   AST 23 08/05/2016  NA 139 08/05/2016   K 4.3 08/05/2016   CL 104 08/05/2016   CREATININE 0.90 08/05/2016   BUN 13 08/05/2016   CO2 31 08/05/2016   TSH 1.19 04/01/2016   PSA 1.09 08/26/2013   INR 1.04 12/17/2015   HGBA1C 6.9 (H) 08/05/2016   MICROALBUR 13.46 (H) 08/26/2013    Ct Abdomen W Wo Contrast  Result Date: 04/29/2016 CLINICAL DATA:  Left upper quadrant abdominal pain, bloating and diarrhea since August. EXAM: CT ABDOMEN WITHOUT AND WITH CONTRAST TECHNIQUE: Multidetector CT imaging of the abdomen was performed following the standard protocol before and following the bolus administration of intravenous contrast. CONTRAST:  166m ISOVUE-300 IOPAMIDOL (ISOVUE-300) INJECTION 61% COMPARISON:  None. FINDINGS: Lower chest: The lung bases are  clear of acute process. No pleural effusion or pulmonary lesions. The heart is normal in size. No pericardial effusion. The distal esophagus and aorta are unremarkable. Hepatobiliary: No focal hepatic lesions or intrahepatic biliary dilatation. The gallbladder is normal. No common bile duct dilatation. Pancreas: No mass, inflammation or ductal dilatation. Spleen: Normal size.  No focal lesions. Adrenals/Urinary Tract: The adrenal glands and kidneys are normal. Stomach/Bowel: The stomach, duodenum, small bowel and colon are unremarkable. High attenuation tear antral region of the stomach is likely a pill fragment. No inflammatory changes, mass lesions or obstructive findings. The terminal ileum is normal. The appendix is normal. Vascular/Lymphatic: The aorta and branch vessels are normal. The major venous structures are normal. Small scattered mesenteric and retroperitoneal lymph nodes but no mass or overt the adenopathy. Other: No ascites or abdominal wall hernia. Musculoskeletal: No significant bony findings. Moderate facet disease for age IMPRESSION: No acute abdominal findings, mass lesions are adenopathy. No findings to account for the patient's left upper quadrant pain. Electronically Signed   By: PMarijo SanesM.D.   On: 04/29/2016 14:29    Assessment & Plan:   CMajidwas seen today for pain.  Diagnoses and all orders for this visit:  Acute pain of right shoulder -     Naproxen-Esomeprazole (VIMOVO) 500-20 MG TBEC; Take 1 tablet by mouth 2 (two) times daily between meals as needed. -     Diclofenac Sodium (PENNSAID) 2 % SOLN; Place 1 application onto the skin 2 (two) times daily as needed.   I have discontinued Mr. WBrancatoibuprofen. I am also having him start on Naproxen-Esomeprazole and Diclofenac Sodium. Additionally, I am having him maintain his multivitamin with minerals, L-ARGININE PO, furosemide, lisinopril, blood glucose meter kit and supplies, doxycycline, and glyBURIDE.  Meds ordered  this encounter  Medications  . Naproxen-Esomeprazole (VIMOVO) 500-20 MG TBEC    Sig: Take 1 tablet by mouth 2 (two) times daily between meals as needed.    Dispense:  20 tablet    Refill:  1    Order Specific Question:   Supervising Provider    Answer:   PCassandria Anger[[id number]]  . Diclofenac Sodium (PENNSAID) 2 % SOLN    Sig: Place 1 application onto the skin 2 (two) times daily as needed.    Dispense:  2 g    Refill:  1    Order Specific Question:   Supervising Provider    Answer:   PCassandria Anger[[id number]]    Follow-up: Return if symptoms worsen or fail to improve.  CWilfred Lacy NP

## 2016-09-23 ENCOUNTER — Ambulatory Visit: Payer: 59 | Admitting: Podiatry

## 2016-10-16 ENCOUNTER — Ambulatory Visit (INDEPENDENT_AMBULATORY_CARE_PROVIDER_SITE_OTHER): Payer: 59 | Admitting: Podiatry

## 2016-10-16 ENCOUNTER — Ambulatory Visit (INDEPENDENT_AMBULATORY_CARE_PROVIDER_SITE_OTHER): Payer: 59

## 2016-10-16 ENCOUNTER — Encounter: Payer: Self-pay | Admitting: Podiatry

## 2016-10-16 DIAGNOSIS — M79676 Pain in unspecified toe(s): Secondary | ICD-10-CM | POA: Diagnosis not present

## 2016-10-16 DIAGNOSIS — B351 Tinea unguium: Secondary | ICD-10-CM

## 2016-10-16 DIAGNOSIS — E1142 Type 2 diabetes mellitus with diabetic polyneuropathy: Secondary | ICD-10-CM | POA: Diagnosis not present

## 2016-10-16 DIAGNOSIS — M1460 Charcot's joint, unspecified site: Secondary | ICD-10-CM | POA: Diagnosis not present

## 2016-10-16 NOTE — Progress Notes (Signed)
He presents today she complained of painful elongated toenails as well as Charcot foot.  Objective: Vital signs are stable he's alert and oriented 3 has his A1c down to 6.5 at this point. His tendon is elongated yellow dystrophic onychomycotic much decrease in edema to the bilateral foot. Radiographs taken today demonstrate consolidation of his right Charcot midfoot.  Assessment: Well-healing Charcot midfoot. His diabetes is better controlled. Toenails are painful due to their onychomycosis and diabetic peripheral neuropathy.  Plan: Debridement of toenails 1 through 5 bilateral. Follow up within 3 months.

## 2016-11-04 ENCOUNTER — Other Ambulatory Visit (INDEPENDENT_AMBULATORY_CARE_PROVIDER_SITE_OTHER): Payer: 59

## 2016-11-04 ENCOUNTER — Encounter: Payer: Self-pay | Admitting: Internal Medicine

## 2016-11-04 ENCOUNTER — Ambulatory Visit (INDEPENDENT_AMBULATORY_CARE_PROVIDER_SITE_OTHER): Payer: 59 | Admitting: Internal Medicine

## 2016-11-04 VITALS — BP 122/86 | HR 102 | Temp 98.4°F | Resp 16 | Ht 74.0 in | Wt 303.0 lb

## 2016-11-04 DIAGNOSIS — E118 Type 2 diabetes mellitus with unspecified complications: Secondary | ICD-10-CM | POA: Diagnosis not present

## 2016-11-04 DIAGNOSIS — R600 Localized edema: Secondary | ICD-10-CM | POA: Diagnosis not present

## 2016-11-04 DIAGNOSIS — I10 Essential (primary) hypertension: Secondary | ICD-10-CM | POA: Diagnosis not present

## 2016-11-04 LAB — COMPREHENSIVE METABOLIC PANEL
ALBUMIN: 4.1 g/dL (ref 3.5–5.2)
ALT: 17 U/L (ref 0–53)
AST: 17 U/L (ref 0–37)
Alkaline Phosphatase: 88 U/L (ref 39–117)
BUN: 13 mg/dL (ref 6–23)
CO2: 29 mEq/L (ref 19–32)
Calcium: 9.6 mg/dL (ref 8.4–10.5)
Chloride: 105 mEq/L (ref 96–112)
Creatinine, Ser: 1.02 mg/dL (ref 0.40–1.50)
GFR: 100.18 mL/min (ref >60.00)
Glucose, Bld: 155 mg/dL — ABNORMAL HIGH (ref 70–99)
Potassium: 4.2 mEq/L (ref 3.5–5.1)
SODIUM: 138 meq/L (ref 135–145)
TOTAL PROTEIN: 8 g/dL (ref 6.0–8.3)
Total Bilirubin: 0.5 mg/dL (ref 0.2–1.2)

## 2016-11-04 LAB — HEMOGLOBIN A1C: Hgb A1c MFr Bld: 7.9 % — ABNORMAL HIGH (ref 4.6–6.5)

## 2016-11-04 NOTE — Assessment & Plan Note (Signed)
Continue weight loss efforts and restart regular exercise Check A1c Goal is to get off medication eventually

## 2016-11-04 NOTE — Assessment & Plan Note (Addendum)
Diastolic slightly elevated, but the pressure controlled Continue current medication Continue weight loss efforts Start regular exercise CMP

## 2016-11-04 NOTE — Progress Notes (Signed)
Subjective:    Patient ID: Randall Morton, male    DOB: 07-29-1970, 46 y.o.   MRN: [id number]  HPI The patient is here for follow up.  Hypertension: He is taking his medication daily. He is compliant with a low sodium diet.  He denies chest pain, palpitations, shortness of breath and regular headaches. He is not exercising regularly.  He does not monitor his blood pressure at home.    Diabetes: He is taking his medication daily as prescribed. He is compliant with a diabetic diet. He is not exercising regularly. He does not monitor his sugars.  He thinks he had one episode of hypoglycemia.  He checks his feet daily and denies foot lesions. He is up-to-date with an ophthalmology examination.   He has lost weight.   He has decreased his food intake.  He has been drinking a lot water. He has not been exercising regularly recently.  Leg swelling: His leg swelling in the right leg is slightly better. He thinks this is related to his weight loss.  Medications and allergies reviewed with patient and updated if appropriate.  Patient Active Problem List   Diagnosis Date Noted  . Rash 08/05/2016  . Leg edema, right 08/05/2016  . Obese 02/05/2016  . 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 Low HDL (under 40) 12/15/2015  . Toe osteomyelitis, left ([place]) 07/11/2014  . Type II diabetes mellitus with manifestations ([place]) 08/26/2013  . Neuropathy due to secondary diabetes ([place]) 08/26/2013  . HTN (hypertension) 08/26/2013    Current Outpatient Prescriptions on File Prior to Visit  Medication Sig Dispense Refill  . blood glucose meter kit and supplies KIT Dispense based on patient and insurance preference. Use up to four times daily as directed. (FOR ICD-9 250.00, 250.01). 1 each 0  . Diclofenac Sodium (PENNSAID) 2 % SOLN Place 1 application onto the skin 2 (two) times daily as needed. 2 g 1  . furosemide (LASIX) 20 MG tablet Take 1 tablet (20 mg total) by mouth daily. 30 tablet 5  . glyBURIDE  (DIABETA) 5 MG tablet Take 1 tablet (5 mg total) by mouth 2 (two) times daily with a meal. Take two tablets with breakfast and one tablet with dinner 60 tablet 5  . L-ARGININE PO Take 1 tablet by mouth as needed.     [name] lisinopril (PRINIVIL,ZESTRIL) 10 MG tablet Take 1 tablet (10 mg total) by mouth daily. 30 tablet 5  . Multiple Vitamins-Minerals (MULTIVITAMIN WITH MINERALS) tablet Take 1 tablet by mouth daily.    . Naproxen-Esomeprazole (VIMOVO) 500-20 MG TBEC Take 1 tablet by mouth 2 (two) times daily between meals as needed. 20 tablet 1   No current facility-administered medications on file prior to visit.     Past Medical History:  Diagnosis Date  . Diabetes mellitus without complication ([place])   . 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 Diabetic retinopathy ([place])    last checked on 04/2014  . Hypertension   . Osteomyelitis ([place])    left foot ulcer--> osteo  . Polyneuropathy in diabetes [facility])     Past Surgical History:  Procedure Laterality Date  . CARDIAC CATHETERIZATION N/A 12/17/2015   Procedure: Left Heart Cath and Coronary Angiography;  Surgeon: Josue Hector, MD;  Location: [place] [facility];  Service: Cardiovascular;  Laterality: N/A;    Social History   Social History  . Marital status: Single    Spouse name: N/A  . Number of  children: 2  . Years of education: N/A   Occupational History  . Sheet Metal Mechanic    Social History Main Topics  . Smoking status: Never Smoker  . Smokeless tobacco: Never Used  . Alcohol use 0.0 oz/week     Comment: socially  . Drug use: No  . Sexual activity: Not Asked   Other Topics Concern  . None   Social History Narrative  . None    Family History  Problem Relation Age of Onset  . Asthma Mother   . Diabetes Father   . Heart disease Father   . Hyperlipidemia Father   . Hypertension Father   . Stroke Father   . Prostate cancer Paternal Grandfather   . Colon cancer Neg Hx   . Rectal cancer Neg Hx     . Stomach cancer Neg Hx   . Esophageal cancer Neg Hx   . Liver cancer Neg Hx     Review of Systems  Constitutional: Negative for chills and fever.  Respiratory: Negative for cough, shortness of breath and wheezing.   Cardiovascular: Positive for leg swelling. Negative for chest pain and palpitations.  Neurological: Positive for numbness (intermittent in right foot). Negative for light-headedness and headaches.       Objective:   Vitals:   11/04/16 1443  BP: 122/86  Pulse: (!) 102  Resp: 16  Temp: 98.4 F (36.9 C)   Wt Readings from Last 3 Encounters:  11/04/16 (!) 303 lb (137.4 kg)  09/16/16 (!) 313 lb (142 kg)  08/05/16 (!) 310 lb (140.6 kg)   Body mass index is 38.9 kg/m.   Physical Exam    Constitutional: Appears well-developed and well-nourished. No distress.  HENT:  Head: Normocephalic and atraumatic.  Neck: Neck supple. No tracheal deviation present. No thyromegaly present.  No cervical lymphadenopathy Cardiovascular: Normal rate, regular rhythm and normal heart sounds.   No murmur heard. No carotid bruit .  Mild bilateral lower extremity edema Pulmonary/Chest: Effort normal and breath sounds normal. No respiratory distress. No has no wheezes. No rales.  Skin: Skin is warm and dry. Not diaphoretic.  Psychiatric: Normal mood and affect. Behavior is normal.      Assessment & Plan:    See Problem List for Assessment and Plan of chronic medical problems.

## 2016-11-04 NOTE — Assessment & Plan Note (Signed)
Improves with weight loss Continue Lasix 20 mg daily CMP today

## 2016-11-04 NOTE — Patient Instructions (Signed)
  Test(s) ordered today. Your results will be released to MyChart (or called to you) after review, usually within 72hours after test completion. If any changes need to be made, you will be notified at that same time.  Medications reviewed and updated.  No changes recommended at this time.    Please followup in 6 months   

## 2016-12-04 ENCOUNTER — Encounter: Payer: Self-pay | Admitting: Podiatry

## 2016-12-04 ENCOUNTER — Ambulatory Visit (INDEPENDENT_AMBULATORY_CARE_PROVIDER_SITE_OTHER): Payer: 59 | Admitting: Podiatry

## 2016-12-04 ENCOUNTER — Ambulatory Visit: Payer: 59

## 2016-12-04 DIAGNOSIS — M79676 Pain in unspecified toe(s): Secondary | ICD-10-CM | POA: Diagnosis not present

## 2016-12-04 DIAGNOSIS — Q828 Other specified congenital malformations of skin: Secondary | ICD-10-CM

## 2016-12-04 DIAGNOSIS — B351 Tinea unguium: Secondary | ICD-10-CM | POA: Diagnosis not present

## 2016-12-04 DIAGNOSIS — M1460 Charcot's joint, unspecified site: Secondary | ICD-10-CM

## 2016-12-07 NOTE — Progress Notes (Signed)
He presents today she is complaining discoloration of the hallux left. Right foot Charcot deformity is doing well.  Objective: Vital signs stable alert and oriented 3. Pulses are palpable. Neurologic sensorium is unchanged. Subungual hematoma hallux left. Distal clavus to the hallux left. Usually secondary to shoe gear being too short. We evaluated that day Does appear that issue is his bit short with a likely resulting in the rubbing. Radiographs do not demonstrate any type of new osseous abnormalities. Toenails are long thick yellow dystrophic onychomycotic  Assessment subungual hematoma. Distal clavus hallux left. Pain limb secondary onychomycosis. Diabetes mellitus type 2 with diabetic Charcot deformity. Diabetic peripheral neuropathy.  Plan: Debrided toenails 1 through 5 bilateral debrided all reactive hyperkeratoses. Discussed appropriate shoe gear.

## 2017-01-05 ENCOUNTER — Telehealth: Payer: Self-pay | Admitting: Internal Medicine

## 2017-01-05 MED ORDER — LISINOPRIL 10 MG PO TABS: 10.0000 mg | ORAL_TABLET | Freq: Every day | ORAL | 5 refills | Status: DC

## 2017-01-05 MED ORDER — GLYBURIDE 5 MG PO TABS: 5.0000 mg | ORAL_TABLET | Freq: Two times a day (BID) | ORAL | 5 refills | Status: DC

## 2017-01-05 MED ORDER — FUROSEMIDE 20 MG PO TABS: 20.0000 mg | ORAL_TABLET | Freq: Every day | ORAL | 5 refills | Status: AC

## 2017-01-05 NOTE — Telephone Encounter (Signed)
Pt needs refill on   Glyburide  Furosemide  Lisinopril   [facility] on [address]

## 2017-01-09 ENCOUNTER — Encounter: Payer: Self-pay | Admitting: Podiatry

## 2017-01-09 ENCOUNTER — Ambulatory Visit (INDEPENDENT_AMBULATORY_CARE_PROVIDER_SITE_OTHER): Payer: 59 | Admitting: Podiatry

## 2017-01-09 ENCOUNTER — Ambulatory Visit (INDEPENDENT_AMBULATORY_CARE_PROVIDER_SITE_OTHER): Payer: 59

## 2017-01-09 VITALS — BP 118/76 | HR 93 | Temp 97.7°F | Resp 18

## 2017-01-09 DIAGNOSIS — M14672 Charcot's joint, left ankle and foot: Secondary | ICD-10-CM

## 2017-01-09 DIAGNOSIS — M79672 Pain in left foot: Secondary | ICD-10-CM | POA: Diagnosis not present

## 2017-01-09 DIAGNOSIS — R52 Pain, unspecified: Secondary | ICD-10-CM

## 2017-01-09 DIAGNOSIS — R609 Edema, unspecified: Secondary | ICD-10-CM

## 2017-01-12 NOTE — Progress Notes (Signed)
Subjective: 46 year old male presents the office today for concerns of left foot swelling, warmth which is been ongoing the last several weeks. He states it started shortly after his last appointment with Dr. Al CorpusHyatt. He has a history of Charcot on the right foot. He believes the same thing is happy on the left. Denies any recent injury or trauma. Denies any red streaking. No recent injury. Denies any systemic complaints such as fevers, chills, nausea, vomiting. No acute changes since last appointment, and no other complaints at this time.   Objective: AAO x3, NAD DP/PT pulses palpable bilaterally, CRT less than 3 seconds Protective sensation decreased with Simms Weinstein monofilament There is mild to moderate edema to left foot is increase in warmth. There is no specific area pinpoint bony tenderness or pain the vibratory sensation. Ankle joint range of motion intact. There is no symmetric and deformity to the left foot at this time however flatfoot is evident. There is rocker-bottom deformity to the right foot. No swelling or redness or warmth of the right foot. No pain with calf compression, swelling, warmth, erythema  Assessment: Charcot left foot  Plan: -All treatment options discussed with the patient including all alternatives, risks, complications.  -X-rays were obtained and reviewed which did reveal Charcot changes present with significant osteoarthritis mostly on the midfoot on the left side. -Cam boot was dispensed today. He should wear this all times. I would ultimately like him to be nonweightbearing however given the Charcot and the right foot will weight-bear. Unna boot was also applied today. Can remove this in 5 days or sooner if needed -Follow-up as scheduled. -Patient encouraged to call the office with any questions, concerns, change in symptoms.   Ovid CurdMatthew Tinia Oravec, DPM

## 2017-01-14 ENCOUNTER — Encounter: Payer: Self-pay | Admitting: Podiatry

## 2017-01-15 ENCOUNTER — Ambulatory Visit: Payer: 59 | Admitting: Podiatry

## 2017-01-27 ENCOUNTER — Encounter: Payer: Self-pay | Admitting: Podiatry

## 2017-01-27 ENCOUNTER — Ambulatory Visit (INDEPENDENT_AMBULATORY_CARE_PROVIDER_SITE_OTHER): Payer: 59 | Admitting: Podiatry

## 2017-01-27 DIAGNOSIS — M14672 Charcot's joint, left ankle and foot: Secondary | ICD-10-CM

## 2017-01-27 NOTE — Progress Notes (Signed)
He presents today for Charcot arthropathy of his left foot now. He states the pain does not seem to be as great. He presents weightbearing cam walker with an Radio broadcast assistantUnna boot on.  Objective: Unna boot intact once removed demonstrates flattened foot prominent protrusion to the plantar aspect. No open lesions or wounds are noted.  Assessment: Charcot arthropathy left foot still in the first stage.  Plan: Put another Unna boot on him and place him back in his cam walker. Recommended nonweightbearing utilizing crutches or knee scooter.  Also wrote a note stating that with his current medical condition he is no longer able to perform a job that requires him to stand 6-8 hours a day. I do believe that because of his continued Charcot deformity in both feet he will end up having to go on to some type of disability or job where he can sit.

## 2017-02-01 ENCOUNTER — Emergency Department (HOSPITAL_COMMUNITY): Payer: No Typology Code available for payment source

## 2017-02-01 ENCOUNTER — Emergency Department (HOSPITAL_COMMUNITY)
Admission: EM | Admit: 2017-02-01 | Discharge: 2017-02-01 | Disposition: A | Discharge status: Home / Self Care | Payer: No Typology Code available for payment source | Attending: Emergency Medicine | Admitting: Emergency Medicine

## 2017-02-01 ENCOUNTER — Encounter (HOSPITAL_COMMUNITY): Payer: Self-pay | Admitting: Emergency Medicine

## 2017-02-01 DIAGNOSIS — S161XXA Strain of muscle, fascia and tendon at neck level, initial encounter: Secondary | ICD-10-CM | POA: Diagnosis not present

## 2017-02-01 DIAGNOSIS — Y999 Unspecified external cause status: Secondary | ICD-10-CM | POA: Insufficient documentation

## 2017-02-01 DIAGNOSIS — I10 Essential (primary) hypertension: Secondary | ICD-10-CM | POA: Insufficient documentation

## 2017-02-01 DIAGNOSIS — M25511 Pain in right shoulder: Secondary | ICD-10-CM | POA: Diagnosis not present

## 2017-02-01 DIAGNOSIS — Z79899 Other long term (current) drug therapy: Secondary | ICD-10-CM | POA: Diagnosis not present

## 2017-02-01 DIAGNOSIS — M549 Dorsalgia, unspecified: Secondary | ICD-10-CM | POA: Diagnosis not present

## 2017-02-01 DIAGNOSIS — E119 Type 2 diabetes mellitus without complications: Secondary | ICD-10-CM | POA: Diagnosis not present

## 2017-02-01 DIAGNOSIS — Z7984 Long term (current) use of oral hypoglycemic drugs: Secondary | ICD-10-CM | POA: Insufficient documentation

## 2017-02-01 DIAGNOSIS — Y9389 Activity, other specified: Secondary | ICD-10-CM | POA: Diagnosis not present

## 2017-02-01 DIAGNOSIS — Y92410 Unspecified street and highway as the place of occurrence of the external cause: Secondary | ICD-10-CM | POA: Diagnosis not present

## 2017-02-01 DIAGNOSIS — S199XXA Unspecified injury of neck, initial encounter: Secondary | ICD-10-CM | POA: Diagnosis present

## 2017-02-01 MED ORDER — CYCLOBENZAPRINE HCL 10 MG PO TABS: 10.0000 mg | ORAL_TABLET | Freq: Two times a day (BID) | ORAL | 0 refills | Status: DC | PRN

## 2017-02-01 MED ORDER — TRAMADOL HCL 50 MG PO TABS
50.0000 mg | ORAL_TABLET | Freq: Once | ORAL | Status: AC
  Administered 2017-02-01: 50 mg via ORAL
  Filled 2017-02-01: qty 1

## 2017-02-01 MED ORDER — CYCLOBENZAPRINE HCL 10 MG PO TABS
10.0000 mg | ORAL_TABLET | Freq: Once | ORAL | Status: AC
  Administered 2017-02-01: 10 mg via ORAL
  Filled 2017-02-01: qty 1
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
## 2017-02-01 NOTE — Discharge Instructions (Signed)
Do not drive while taking the muscle relaxant as it will make you sleepy. You may take tylenol in addition to the muscle relaxant.
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
## 2017-02-10 ENCOUNTER — Encounter: Payer: Self-pay | Admitting: Podiatry

## 2017-02-10 ENCOUNTER — Ambulatory Visit (INDEPENDENT_AMBULATORY_CARE_PROVIDER_SITE_OTHER): Payer: 59 | Admitting: Podiatry

## 2017-02-10 DIAGNOSIS — M14672 Charcot's joint, left ankle and foot: Secondary | ICD-10-CM | POA: Diagnosis not present

## 2017-02-11 NOTE — Progress Notes (Signed)
He presents today stating that his foot is feeling much better. States the swelling seems to be going down continues to ambulate with his Cam Walker.  Objective: Vital signs are stable he is alert and oriented 3. Pulses are palpable. Much decrease in edema to the left lower extremity much decrease in erythema no cellulitis drainage or odor. Still very hyperflexible.  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  Plan: Place him in another Unna boot put it back in his Cam Walker follow-up with him in 3 weeks recommended that he stay off the foot as much as possible utilizing crutches.

## 2017-03-03 ENCOUNTER — Ambulatory Visit (INDEPENDENT_AMBULATORY_CARE_PROVIDER_SITE_OTHER): Payer: 59

## 2017-03-03 ENCOUNTER — Encounter: Payer: Self-pay | Admitting: Podiatry

## 2017-03-03 ENCOUNTER — Ambulatory Visit (INDEPENDENT_AMBULATORY_CARE_PROVIDER_SITE_OTHER): Payer: 59 | Admitting: Podiatry

## 2017-03-03 DIAGNOSIS — E11610 Type 2 diabetes mellitus with diabetic neuropathic arthropathy: Secondary | ICD-10-CM

## 2017-03-04 NOTE — Progress Notes (Signed)
He presents today for follow-up of his Charcot deformity. States it is starting to feel better and feel more stable. Continues to wear his Cam Walker. Unna boot is intact.  Objective: Pulses are palpable left swelling has diminished considerably radiographs taken today demonstrate consolidation phase no open wounds or lesions.  Assessment: Consolidation phase Charcot deformity left foot.  Plan: Continue Cam Walker for the next 3-4 weeks asprogresses. I will follow-up with him at that time for another set of x-rays.

## 2017-03-12 ENCOUNTER — Other Ambulatory Visit: Payer: Self-pay | Admitting: Internal Medicine

## 2017-03-12 NOTE — Progress Notes (Deleted)
Subjective:    Patient ID: Randall Morton, male    DOB: 14-Jun-1971, 46 y.o.   MRN: [id number]  HPI He is here for follow up.  Charcot ankle, left lower extremity:  He is following with podiatry.  He has been having a unna boot and is using a cam walker.  Podiatry, Randall Morton, feels he is no longer able to stand for long periods of time and therefore can no longer perform his job that requires him to stand for 6-8 hrs.  Randall Morton has stated that he feel he will need to go on some type of disability or a job where he can sit.      Medications and allergies reviewed with patient and updated if appropriate.  Patient Active Problem List   Diagnosis Date Noted  . Rash 08/05/2016  . Leg edema, right 08/05/2016  . Obese 02/05/2016  . 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 Low HDL (under 40) 12/15/2015  . Toe osteomyelitis, left ([place]) 07/11/2014  . Type II diabetes mellitus with manifestations ([place]) 08/26/2013  . Neuropathy due to secondary diabetes ([place]) 08/26/2013  . HTN (hypertension) 08/26/2013    Current Outpatient Prescriptions on File Prior to Visit  Medication Sig Dispense Refill  . blood glucose meter kit and supplies KIT Dispense based on patient and insurance preference. Use up to four times daily as directed. (FOR ICD-9 250.00, 250.01). 1 each 0  . cyclobenzaprine (FLEXERIL) 10 MG tablet Take 1 tablet (10 mg total) by mouth 2 (two) times daily as needed for muscle spasms. 20 tablet 0  . Diclofenac Sodium (PENNSAID) 2 % SOLN Place 1 application onto the skin 2 (two) times daily as needed. 2 g 1  . furosemide (LASIX) 20 MG tablet Take 1 tablet (20 mg total) by mouth daily. 30 tablet 5  . glyBURIDE (DIABETA) 5 MG tablet Take 1 tablet (5 mg total) by mouth 2 (two) times daily with a meal. Take two tablets with breakfast and one tablet with dinner 60 tablet 5  . L-ARGININE PO Take 1 tablet by mouth as needed.     [name] lisinopril (PRINIVIL,ZESTRIL) 10 MG tablet Take 1 tablet (10 mg total)  by mouth daily. 30 tablet 5  . Multiple Vitamins-Minerals (MULTIVITAMIN WITH MINERALS) tablet Take 1 tablet by mouth daily.    . Naproxen-Esomeprazole (VIMOVO) 500-20 MG TBEC Take 1 tablet by mouth 2 (two) times daily between meals as needed. 20 tablet 1   No current facility-administered medications on file prior to visit.     Past Medical History:  Diagnosis Date  . Diabetes mellitus without complication ([place])   . 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 Diabetic retinopathy ([place])    last checked on 04/2014  . Hypertension   . Osteomyelitis ([place])    left foot ulcer--> osteo  . Polyneuropathy in diabetes [facility])     Past Surgical History:  Procedure Laterality Date  . CARDIAC CATHETERIZATION N/A 12/17/2015   Procedure: Left Heart Cath and Coronary Angiography;  Surgeon: Randall Hector, MD;  Location: [facility];  Service: Cardiovascular;  Laterality: N/A;    Social History   Social History  . Marital status: Single    Spouse name: N/A  . Number of children: 2  . Years of education: N/A   Occupational History  . Sheet Metal Mechanic    Social History Main Topics  . Smoking status: Never Smoker  . Smokeless tobacco: Never Used  .  Alcohol use 0.0 oz/week     Comment: socially  . Drug use: No  . Sexual activity: Not on file   Other Topics Concern  . Not on file   Social History Narrative  . No narrative on file    Family History  Problem Relation Age of Onset  . Asthma Mother   . Diabetes Father   . Heart disease Father   . Hyperlipidemia Father   . Hypertension Father   . Stroke Father   . Prostate cancer Paternal Grandfather   . Colon cancer Neg Hx   . Rectal cancer Neg Hx   . Stomach cancer Neg Hx   . Esophageal cancer Neg Hx   . Liver cancer Neg Hx     Review of Systems     Objective:  There were no vitals filed for this visit. There were no vitals filed for this visit. There is no height or weight on file to calculate  BMI.  Wt Readings from Last 3 Encounters:  02/01/17 (!) 305 lb (138.3 kg)  11/04/16 (!) 303 lb (137.4 kg)  09/16/16 (!) 313 lb (142 kg)     Physical Exam        Assessment & Plan:   See Problem List for Assessment and Plan of chronic medical problems.

## 2017-03-13 ENCOUNTER — Ambulatory Visit: Payer: 59 | Admitting: Internal Medicine

## 2017-03-14 NOTE — Progress Notes (Signed)
Subjective:    Patient ID: Randall Morton, male    DOB: 12/17/1970, 46 y.o.   MRN: [id number]  HPI He is here for follow up.  Charcot ankle in right foot, left lower extremity: He has charcot in the right foot and it is now in the left.  He is following with podiatry.  He has been having a unna boot and is using a cam walker.  Podiatry, Dr Milinda Pointer, feels he is no longer able to stand for long periods of time and therefore can no longer perform his job that requires him to stand for 6-8 hrs.  Dr Milinda Pointer has stated that he feel he will need to go on some type of disability or a job where he can sit.  He was taken out of work by Dr Milinda Pointer.  He is applying for SS disability.    He can stand for 1-2 hours at a time only.  His balance is off.  He trips over things.  He has pain when sitting, but it is worse with standing or walking.  The pain is worse at the end of the day.  His pain is in both feet/ankles and it is a 7/10.  He uses a cane to walk.  He is currently not working.   Diabetes: He is taking his medication daily as prescribed. He is compliant with a diabetic diet. He is not exercising regularly. He plans on starting to use resistance bands.  He does not monitor his sugars.  He checks his feet daily and denies foot lesions. He is not up-to-date with an ophthalmology examination.   Hypertension: He is taking his medication daily. He is compliant with a low sodium diet.  He denies chest pain, palpitations and regular headaches. He is not exercising regularly.     Medications and allergies reviewed with patient and updated if appropriate.  Patient Active Problem List   Diagnosis Date Noted  . Rash 08/05/2016  . Leg edema, right 08/05/2016  . Obese 02/05/2016  . 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 Low HDL (under 40) 12/15/2015  . Toe osteomyelitis, left ([place]) 07/11/2014  . Type II diabetes mellitus with manifestations ([place]) 08/26/2013  . Neuropathy due to secondary diabetes ([place])  08/26/2013  . HTN (hypertension) 08/26/2013    Current Outpatient Prescriptions on File Prior to Visit  Medication Sig Dispense Refill  . blood glucose meter kit and supplies KIT Dispense based on patient and insurance preference. Use up to four times daily as directed. (FOR ICD-9 250.00, 250.01). 1 each 0  . cyclobenzaprine (FLEXERIL) 10 MG tablet Take 1 tablet (10 mg total) by mouth 2 (two) times daily as needed for muscle spasms. 20 tablet 0  . Diclofenac Sodium (PENNSAID) 2 % SOLN Place 1 application onto the skin 2 (two) times daily as needed. 2 g 1  . furosemide (LASIX) 20 MG tablet Take 1 tablet (20 mg total) by mouth daily. 30 tablet 5  . L-ARGININE PO Take 1 tablet by mouth as needed.     [name] lisinopril (PRINIVIL,ZESTRIL) 10 MG tablet Take 1 tablet (10 mg total) by mouth daily. 30 tablet 5  . Multiple Vitamins-Minerals (MULTIVITAMIN WITH MINERALS) tablet Take 1 tablet by mouth daily.    . Naproxen-Esomeprazole (VIMOVO) 500-20 MG TBEC Take 1 tablet by mouth 2 (two) times daily between meals as needed. 20 tablet 1   No current facility-administered medications on file prior to visit.     Past Medical History:  Diagnosis Date  . Diabetes mellitus without complication ([place])   . 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 Diabetic retinopathy ([place])    last checked on 04/2014  . Hypertension   . Osteomyelitis ([place])    left foot ulcer--> osteo  . Polyneuropathy in diabetes [facility])     Past Surgical History:  Procedure Laterality Date  . CARDIAC CATHETERIZATION N/A 12/17/2015   Procedure: Left Heart Cath and Coronary Angiography;  Surgeon: Josue Hector, MD;  Location: [place] [facility];  Service: Cardiovascular;  Laterality: N/A;    Social History   Social History  . Marital status: Single    Spouse name: N/A  . Number of children: 2  . Years of education: N/A   Occupational History  . Sheet Metal Mechanic    Social History Main Topics  . Smoking status:  Never Smoker  . Smokeless tobacco: Never Used  . Alcohol use 0.0 oz/week     Comment: socially  . Drug use: No  . Sexual activity: Not Asked   Other Topics Concern  . None   Social History Narrative  . None    Family History  Problem Relation Age of Onset  . Asthma Mother   . Diabetes Father   . Heart disease Father   . Hyperlipidemia Father   . Hypertension Father   . Stroke Father   . Prostate cancer Paternal Grandfather   . Colon cancer Neg Hx   . Rectal cancer Neg Hx   . Stomach cancer Neg Hx   . Esophageal cancer Neg Hx   . Liver cancer Neg Hx     Review of Systems  Constitutional: Negative for chills and fever.  Respiratory: Positive for shortness of breath (with walking - new, worse with stairs). Negative for cough and wheezing.   Cardiovascular: Positive for leg swelling (at end of day). Negative for chest pain and palpitations.  Neurological: Positive for numbness (numbness, tingling, pain, tightness in b/l feet). Negative for light-headedness and headaches.       Objective:   Vitals:   03/16/17 0815  BP: 128/86  Pulse: 99  Temp: 97.8 F (36.6 C)  SpO2: 100%   Filed Weights   03/16/17 0815  Weight: (!) 309 lb (140.2 kg)   Body mass index is 39.67 kg/m.  Wt Readings from Last 3 Encounters:  03/16/17 (!) 309 lb (140.2 kg)  02/01/17 (!) 305 lb (138.3 kg)  11/04/16 (!) 303 lb (137.4 kg)     Physical Exam Constitutional: Appears well-developed and well-nourished. No distress.  HENT:  Head: Normocephalic and atraumatic.  Neck: Neck supple. No tracheal deviation present. No thyromegaly present.  No cervical lymphadenopathy Cardiovascular: Normal rate, regular rhythm and normal heart sounds.   No murmur heard. No carotid bruit .  No edema Pulmonary/Chest: Effort normal and breath sounds normal. No respiratory distress. No has no wheezes. No rales.  Skin: Skin is warm and dry. Not diaphoretic.  Psychiatric: Normal mood and affect. Behavior is  normal.         Assessment & Plan:   See Problem List for Assessment and Plan of chronic medical problems.  FU in 6 months

## 2017-03-16 ENCOUNTER — Ambulatory Visit (INDEPENDENT_AMBULATORY_CARE_PROVIDER_SITE_OTHER): Payer: 59 | Admitting: Internal Medicine

## 2017-03-16 ENCOUNTER — Encounter: Payer: Self-pay | Admitting: Internal Medicine

## 2017-03-16 ENCOUNTER — Other Ambulatory Visit (INDEPENDENT_AMBULATORY_CARE_PROVIDER_SITE_OTHER): Payer: 59

## 2017-03-16 VITALS — BP 128/86 | HR 99 | Temp 97.8°F | Ht 74.0 in | Wt 309.0 lb

## 2017-03-16 DIAGNOSIS — I10 Essential (primary) hypertension: Secondary | ICD-10-CM | POA: Diagnosis not present

## 2017-03-16 DIAGNOSIS — E11610 Type 2 diabetes mellitus with diabetic neuropathic arthropathy: Secondary | ICD-10-CM

## 2017-03-16 DIAGNOSIS — E118 Type 2 diabetes mellitus with unspecified complications: Secondary | ICD-10-CM

## 2017-03-16 DIAGNOSIS — E1340 Other specified diabetes mellitus with diabetic neuropathy, unspecified: Secondary | ICD-10-CM

## 2017-03-16 DIAGNOSIS — Z23 Encounter for immunization: Secondary | ICD-10-CM

## 2017-03-16 LAB — LIPID PANEL
CHOLESTEROL: 163 mg/dL (ref 0–200)
HDL: 24.6 mg/dL — AB (ref >39.00)
LDL CALC: 105 mg/dL — AB (ref 0–99)
NonHDL: 138.39
TRIGLYCERIDES: 168 mg/dL — AB (ref 0.0–149.0)
Total CHOL/HDL Ratio: 7
VLDL: 33.6 mg/dL (ref 0.0–40.0)

## 2017-03-16 LAB — COMPREHENSIVE METABOLIC PANEL
ALT: 16 U/L (ref 0–53)
AST: 16 U/L (ref 0–37)
Albumin: 4.2 g/dL (ref 3.5–5.2)
Alkaline Phosphatase: 86 U/L (ref 39–117)
BUN: 18 mg/dL (ref 6–23)
CALCIUM: 9.6 mg/dL (ref 8.4–10.5)
CHLORIDE: 105 meq/L (ref 96–112)
CO2: 25 meq/L (ref 19–32)
CREATININE: 1.06 mg/dL (ref 0.40–1.50)
GFR: 95.69 mL/min (ref >60.00)
Glucose, Bld: 163 mg/dL — ABNORMAL HIGH (ref 70–99)
Potassium: 5.2 mEq/L — ABNORMAL HIGH (ref 3.5–5.1)
SODIUM: 138 meq/L (ref 135–145)
Total Bilirubin: 0.5 mg/dL (ref 0.2–1.2)
Total Protein: 8.1 g/dL (ref 6.0–8.3)

## 2017-03-16 LAB — HEMOGLOBIN A1C: HEMOGLOBIN A1C: 8 % — AB (ref 4.6–6.5)

## 2017-03-16 LAB — MICROALBUMIN / CREATININE URINE RATIO
Creatinine,U: 189 mg/dL
MICROALB UR: 3.1 mg/dL — AB (ref 0.0–1.9)
Microalb Creat Ratio: 1.6 mg/g (ref 0.0–30.0)

## 2017-03-16 MED ORDER — GLYBURIDE 5 MG PO TABS: ORAL_TABLET | ORAL | 5 refills | Status: DC

## 2017-03-16 NOTE — Assessment & Plan Note (Signed)
Disabled due to b/l charcot's arthropathy Applying for SS disability - no longer able to work Following with podiatry Will check a1c and keep sugars well controlled

## 2017-03-16 NOTE — Assessment & Plan Note (Signed)
BP well controlled Current regimen effective and well tolerated Continue current medications at current doses cmp  

## 2017-03-16 NOTE — Patient Instructions (Addendum)
  Test(s) ordered today. Your results will be released to MyChart (or called to you) after review, usually within 72hours after test completion. If any changes need to be made, you will be notified at that same time.  All other Health Maintenance issues reviewed.   All recommended immunizations and age-appropriate screenings are up-to-date or discussed.  Flu and pneumonia immunizations administered today.   Medications reviewed and updated.  No changes recommended at this time.    Please followup in 6 months

## 2017-03-16 NOTE — Assessment & Plan Note (Signed)
Eye exam not up to date- not covered by insurance Will check a1c Continue diabetic diet Foot exams up to date

## 2017-03-16 NOTE — Assessment & Plan Note (Signed)
Podiatry - Dr Al Corpus Chronic numbness/tingling and pain

## 2017-03-23 ENCOUNTER — Other Ambulatory Visit: Payer: Self-pay | Admitting: Internal Medicine

## 2017-03-23 ENCOUNTER — Other Ambulatory Visit: Payer: Self-pay | Admitting: Emergency Medicine

## 2017-03-23 MED ORDER — EMPAGLIFLOZIN 10 MG PO TABS: 10.0000 mg | ORAL_TABLET | Freq: Every day | ORAL | 5 refills | Status: DC

## 2017-03-24 ENCOUNTER — Ambulatory Visit (INDEPENDENT_AMBULATORY_CARE_PROVIDER_SITE_OTHER): Payer: 59

## 2017-03-24 ENCOUNTER — Ambulatory Visit (INDEPENDENT_AMBULATORY_CARE_PROVIDER_SITE_OTHER): Payer: 59 | Admitting: Podiatry

## 2017-03-24 DIAGNOSIS — B351 Tinea unguium: Secondary | ICD-10-CM | POA: Diagnosis not present

## 2017-03-24 DIAGNOSIS — M79676 Pain in unspecified toe(s): Secondary | ICD-10-CM

## 2017-03-24 DIAGNOSIS — E11610 Type 2 diabetes mellitus with diabetic neuropathic arthropathy: Secondary | ICD-10-CM | POA: Diagnosis not present

## 2017-03-25 NOTE — Progress Notes (Signed)
He presents today for follow-up of his Charcot arthropathy left foot. He states that his feeling a lot better and the swelling has gone down drastically. He states that his nails are long and he needs help trimming those. States that his blood sugar sort of all over the place but was recently an 8.  Objective: Vital signs are stable alert and oriented 3 pulses remain palpable. Still considerable swelling to the left lower extremity and warm to the touch plantarly.  Toenails are long thick yellow dystrophic onychomycotic no open lesions or wounds are noted. Radiographs demonstrate consolidating Charcot arthropathy.  Assessment: Charcot arthropathy with pain in limb secondary to neuropathy and onychomycosis.  Plan: Debridement of toenails 1 through 5 bilaterally. Recommended he continue to utilize his Darco shoe or Cam Walker until completely resolve consolidation has occurred follow-up with him in another 3-4 weeks at which time we'll take another set of x-rays. He is working on getting shoes made for himself.

## 2017-04-08 ENCOUNTER — Telehealth: Payer: Self-pay | Admitting: Podiatry

## 2017-04-08 ENCOUNTER — Encounter: Payer: Self-pay | Admitting: Podiatry

## 2017-04-08 ENCOUNTER — Ambulatory Visit (INDEPENDENT_AMBULATORY_CARE_PROVIDER_SITE_OTHER): Payer: 59

## 2017-04-08 ENCOUNTER — Telehealth: Payer: Self-pay | Admitting: *Deleted

## 2017-04-08 ENCOUNTER — Ambulatory Visit (INDEPENDENT_AMBULATORY_CARE_PROVIDER_SITE_OTHER): Payer: 59 | Admitting: Podiatry

## 2017-04-08 VITALS — BP 114/75 | HR 99 | Temp 98.9°F | Resp 18

## 2017-04-08 DIAGNOSIS — M659 Synovitis and tenosynovitis, unspecified: Secondary | ICD-10-CM | POA: Diagnosis not present

## 2017-04-08 DIAGNOSIS — R609 Edema, unspecified: Secondary | ICD-10-CM | POA: Diagnosis not present

## 2017-04-08 DIAGNOSIS — E11610 Type 2 diabetes mellitus with diabetic neuropathic arthropathy: Secondary | ICD-10-CM | POA: Diagnosis not present

## 2017-04-08 NOTE — Telephone Encounter (Signed)
Pt called states he would like to reschedule for an earlier appt, he is having severe pain in his feet and has a bruise on the bottom of the left.

## 2017-04-08 NOTE — Telephone Encounter (Signed)
I will forward this message to [place] as well Joya [place].

## 2017-04-08 NOTE — Telephone Encounter (Signed)
Left vm for pt to call to schedule appt

## 2017-04-10 NOTE — Progress Notes (Signed)
Subjective: Baldo AshCarl presents the office today for concerns of increased swelling and mild warmth left foot. He has been wearing the cam boot. He denies any recent injury or trauma to the area. He denies any open sores. He does have the foot does throb occasionally. He has no other concerns today. Denies any systemic complaints such as fevers, chills, nausea, vomiting. No acute changes since last appointment, and no other complaints at this time.   Objective: AAO x3, NAD DP/PT pulses palpable bilaterally, CRT less than 3 seconds There is a wider foot present bilaterally due to Charcot. There is no significant rocker-bottom deformity present. There is no open sores identified. There does appear to be mild increase in swelling to the left foot compared to the right side is also increase in warmth left foot compared to the right. There is no erythema identified to the left side. A swelling in the warmth appears to be localized to the foot. No open lesions or pre-ulcerative lesions.  No pain with calf compression, swelling, warmth, erythema  Assessment: Acute Charcot left foot  Plan: -All treatment options discussed with the patient including all alternatives, risks, complications.  -X-rays were obtained and reviewed. There is not appear to be any significant change compared to prior x-rays bilaterally but there is chronic Lisfranc Charcot changes. -This point ideally-to be nonweightbearing in a cast on the left foot however due to the Charcot and the right foot are nervous to make him nonweightbearing on the pressure to the right side. Cellulitis will continue in the cam boot on the left foot but I do want him to refrain from a lot of walking or pressure to the area. Unna boot was applied to left side. He can remove this next couple of days and discussed precautions for this. -He was asking what Charcot was and we had a discussion in regards to what it is, the etiology, and natural progression of the  disorder.  -Follow-up as scheduled or sooner if needed. Call any questions or concerns meantime he agrees to this he has no further questions today.  Ovid CurdMatthew Cyera Balboni, DPM

## 2017-04-14 ENCOUNTER — Ambulatory Visit (INDEPENDENT_AMBULATORY_CARE_PROVIDER_SITE_OTHER): Payer: 59

## 2017-04-14 ENCOUNTER — Ambulatory Visit (INDEPENDENT_AMBULATORY_CARE_PROVIDER_SITE_OTHER): Payer: 59 | Admitting: Podiatry

## 2017-04-14 ENCOUNTER — Ambulatory Visit: Payer: Self-pay

## 2017-04-14 DIAGNOSIS — E1142 Type 2 diabetes mellitus with diabetic polyneuropathy: Secondary | ICD-10-CM

## 2017-04-14 DIAGNOSIS — E11610 Type 2 diabetes mellitus with diabetic neuropathic arthropathy: Secondary | ICD-10-CM

## 2017-04-14 DIAGNOSIS — M14672 Charcot's joint, left ankle and foot: Secondary | ICD-10-CM

## 2017-04-14 NOTE — Progress Notes (Signed)
   Subjective:    Patient ID: Randall Morton, male    DOB: 29-May-1971, 46 y.o.   MRN: [id number]  HPI  Chief Complaint  Patient presents with  . Foot Pain    Charcot arthropathy Left - came in urgently last week due to pain  He states that he's feeling better this week. He states that he feels that he may have overdone it while at his reunion. He states that he would like to consider a custom molded pair of shoes.     Review of Systems     Objective:   Physical Exam: Vital signs are stable he is alert and oriented 3. Pulses are strongly palpable. Charcot deformity remains about the same the foot is warm to the touch and appears to be more swollen than when I saw it last. Radiographs are taken today compared to those from last week does demonstrate increased bone breakdown. No signs of osteomyelitis.        Assessment & Plan:  Assessment: Charcot arthropathy bilateral left foot is active. Right foot is quiet  Plan: He was molded today for diabetic insoles and custom shoes. We also reapplied another Radio broadcast assistantUnna boot.

## 2017-04-15 ENCOUNTER — Ambulatory Visit: Payer: 59 | Admitting: Orthotics

## 2017-04-15 DIAGNOSIS — E11610 Type 2 diabetes mellitus with diabetic neuropathic arthropathy: Secondary | ICD-10-CM

## 2017-04-15 DIAGNOSIS — M14672 Charcot's joint, left ankle and foot: Secondary | ICD-10-CM

## 2017-04-16 NOTE — Progress Notes (Signed)
Patient presented today for evaluation/casting/measurements for custom diabetic shoes.   Patient was cast in fiberglass bilateral; patient chose [employer] 502-C  Black.Measurements Left (larger):  L 12 1/2, BW 5. BC 12 1/8, IN 13.5, Heel 16, Ankle 10.25,  Right L 11, BW 4.5, BC 11, In 10 7/8, heel 15, ankle 10 3/4  $500 self pay price

## 2017-05-04 NOTE — Progress Notes (Signed)
Subjective:    Patient ID: Randall Morton, male    DOB: August 22, 1970, 46 y.o.   MRN: [id number]  HPI    Medications and allergies reviewed with patient and updated if appropriate.  Patient Active Problem List   Diagnosis Date Noted  . Rash 08/05/2016  . Leg edema, right 08/05/2016  . Obese 02/05/2016  . 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 Low HDL (under 40) 12/15/2015  . Toe osteomyelitis, left ([place]) 07/11/2014  . Type II diabetes mellitus with manifestations ([place]) 08/26/2013  . Neuropathy due to secondary diabetes ([place]) 08/26/2013  . HTN (hypertension) 08/26/2013    Current Outpatient Medications on File Prior to Visit  Medication Sig Dispense Refill  . blood glucose meter kit and supplies KIT Dispense based on patient and insurance preference. Use up to four times daily as directed. (FOR ICD-9 250.00, 250.01). 1 each 0  . cyclobenzaprine (FLEXERIL) 10 MG tablet Take 1 tablet (10 mg total) by mouth 2 (two) times daily as needed for muscle spasms. 20 tablet 0  . Diclofenac Sodium (PENNSAID) 2 % SOLN Place 1 application onto the skin 2 (two) times daily as needed. 2 g 1  . empagliflozin (JARDIANCE) 10 MG TABS tablet Take 10 mg by mouth daily. 30 tablet 5  . furosemide (LASIX) 20 MG tablet Take 1 tablet (20 mg total) by mouth daily. 30 tablet 5  . glyBURIDE (DIABETA) 5 MG tablet Take two tablets with breakfast and one tablet with dinner 90 tablet 5  . L-ARGININE PO Take 1 tablet by mouth as needed.     [name] lisinopril (PRINIVIL,ZESTRIL) 10 MG tablet Take 1 tablet (10 mg total) by mouth daily. 30 tablet 5  . Multiple Vitamins-Minerals (MULTIVITAMIN WITH MINERALS) tablet Take 1 tablet by mouth daily.     No current facility-administered medications on file prior to visit.     Past Medical History:  Diagnosis Date  . Diabetes mellitus without complication ([place])   . 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 Diabetic retinopathy ([place])    last checked on 04/2014  .  Hypertension   . Osteomyelitis ([place])    left foot ulcer--> osteo  . Polyneuropathy in diabetes ([place])     No past surgical history on file.  Social History   Socioeconomic History  . Marital status: Single    Spouse name: Not on file  . Number of children: 2  . Years of education: Not on file  . Highest education level: Not on file  Social Needs  . Financial resource strain: Not on file  . Food insecurity - worry: Not on file  . Food insecurity - inability: Not on file  . Transportation needs - medical: Not on file  . Transportation needs - non-medical: Not on file  Occupational History  . Occupation: Aeronautical engineer  Tobacco Use  . Smoking status: Never Smoker  . Smokeless tobacco: Never Used  Substance and Sexual Activity  . Alcohol use: Yes    Alcohol/week: 0.0 oz    Comment: socially  . Drug use: No  . Sexual activity: Not on file  Other Topics Concern  . Not on file  Social History Narrative  . Not on file    Family History  Problem Relation Age of Onset  . Asthma Mother   . Diabetes Father   . Heart disease Father   . Hyperlipidemia Father   . Hypertension Father   . Stroke Father   .  Prostate cancer Paternal Grandfather   . Colon cancer Neg Hx   . Rectal cancer Neg Hx   . Stomach cancer Neg Hx   . Esophageal cancer Neg Hx   . Liver cancer Neg Hx     Review of Systems     Objective:  There were no vitals filed for this visit. Wt Readings from Last 3 Encounters:  03/16/17 (!) 309 lb (140.2 kg)  02/01/17 (!) 305 lb (138.3 kg)  11/04/16 (!) 303 lb (137.4 kg)   There is no height or weight on file to calculate BMI.   Physical Exam          Assessment & Plan:    See Problem List for Assessment and Plan of chronic medical problems.   This encounter was created in error - please disregard. This encounter was created in error - please disregard.

## 2017-05-06 ENCOUNTER — Encounter: Payer: 59 | Admitting: Internal Medicine

## 2017-05-12 ENCOUNTER — Encounter: Payer: Self-pay | Admitting: Podiatry

## 2017-05-12 ENCOUNTER — Ambulatory Visit (INDEPENDENT_AMBULATORY_CARE_PROVIDER_SITE_OTHER): Payer: 59

## 2017-05-12 ENCOUNTER — Ambulatory Visit (INDEPENDENT_AMBULATORY_CARE_PROVIDER_SITE_OTHER): Payer: 59 | Admitting: Podiatry

## 2017-05-12 DIAGNOSIS — E11610 Type 2 diabetes mellitus with diabetic neuropathic arthropathy: Secondary | ICD-10-CM

## 2017-05-12 DIAGNOSIS — B351 Tinea unguium: Secondary | ICD-10-CM

## 2017-05-12 DIAGNOSIS — M79676 Pain in unspecified toe(s): Secondary | ICD-10-CM

## 2017-05-12 NOTE — Progress Notes (Signed)
He presents today for follow-up of his Charcot arthropathy. He states this seems to be doing much better. He would also like to have his nails cut. Now that the weather starting to turn cold he would like to continue his exercise therapy in a gym resistance exercises and aqua therapy.  Objective: Vital signs are stable alert and oriented 3. Pulses are palpable. Swelling to the left lower extremity has gone down considerably after removal of the compression dressing were elevated. There are no open lesions or wounds. Toenails are long patellar dystrophic and clinically mycotic. Radiograph taken today does demonstrate consolidation phase of Charcot arthropathy left foot.  Assessment: Charcot arthropathy pain limiting her onychomycosis and neuropathy.  Plan: Debridement of toenails 1 through 5 bilateral. Recommended the continued use of the cam walker. Follow up with him in 1 month.

## 2017-05-18 ENCOUNTER — Ambulatory Visit: Payer: 59 | Admitting: Internal Medicine

## 2017-05-20 DIAGNOSIS — Z0271 Encounter for disability determination: Secondary | ICD-10-CM

## 2017-05-21 ENCOUNTER — Telehealth: Payer: Self-pay | Admitting: Podiatry

## 2017-05-21 DIAGNOSIS — M79676 Pain in unspecified toe(s): Secondary | ICD-10-CM

## 2017-05-21 NOTE — Telephone Encounter (Signed)
I am calling because I need a CD of my x-rays. I have a court appointment in the morning and I need to take my CD of x-rays for them to have on file. Please call me back to let me know what time I can come pick them up. You can reach me at [phone]. Thank you.

## 2017-05-21 NOTE — Telephone Encounter (Signed)
Called pt to let him know that a CD of his x-rays will be ready for him to pick up today at the front desk between 12:30 - 1:00 pm. I told him there is a $5.00 fee and that he would need to fill out and sign a medical records release form when he came in to pick them up.

## 2017-05-22 ENCOUNTER — Telehealth: Payer: Self-pay | Admitting: *Deleted

## 2017-05-22 NOTE — Telephone Encounter (Signed)
Randall Morton states right shoe is extremely shorter than the left and they need to know more information, is there an amputation.

## 2017-06-05 ENCOUNTER — Encounter: Payer: Self-pay | Admitting: Podiatry

## 2017-06-05 ENCOUNTER — Ambulatory Visit (INDEPENDENT_AMBULATORY_CARE_PROVIDER_SITE_OTHER): Payer: 59 | Admitting: Podiatry

## 2017-06-05 DIAGNOSIS — E11610 Type 2 diabetes mellitus with diabetic neuropathic arthropathy: Secondary | ICD-10-CM

## 2017-06-05 DIAGNOSIS — M1460 Charcot's joint, unspecified site: Secondary | ICD-10-CM | POA: Diagnosis not present

## 2017-06-05 MED ORDER — CALCITONIN (SALMON) 200 UNIT/ACT NA SOLN: 1.0000 | Freq: Every day | NASAL | 1 refills | Status: DC

## 2017-06-05 NOTE — Progress Notes (Signed)
  Subjective:  Patient ID: Randall Morton, male    DOB: July 11, 1970,  MRN: [id number]  Chief Complaint  Patient presents with  . Foot Problem    my left foot is feeling hot    46 y.o. male returns for the above complaint.  States that his left foot started feeling very warm "200 degrees "was concerned that the foot would be infected  Objective:  There were no vitals filed for this visit. General AA&O x3. Normal mood and affect.  Vascular Pedal pulses palpable. L foot hot to touch.  Neurologic Epicritic sensation grossly diminished.  Dermatologic No open lesions. Skin normal texture and turgor.  Orthopedic: No pain to palpation either foot. Charcot foot type bilateral   Assessment & Plan:  Patient was evaluated and treated and all questions answered.  Charcot Bilat Feet, active L foot -Rx calcitonin nasal spray.  Educated on application.  Goal is to lessen the course of the Charcot process -No open ulcerations. -Encouraged NWB.  No Follow-up on file.

## 2017-06-10 DIAGNOSIS — M79676 Pain in unspecified toe(s): Secondary | ICD-10-CM

## 2017-06-11 ENCOUNTER — Encounter: Payer: Self-pay | Admitting: Podiatry

## 2017-06-11 ENCOUNTER — Ambulatory Visit (INDEPENDENT_AMBULATORY_CARE_PROVIDER_SITE_OTHER): Payer: 59 | Admitting: Podiatry

## 2017-06-11 DIAGNOSIS — B351 Tinea unguium: Secondary | ICD-10-CM

## 2017-06-11 DIAGNOSIS — M79676 Pain in unspecified toe(s): Secondary | ICD-10-CM

## 2017-06-11 DIAGNOSIS — E11610 Type 2 diabetes mellitus with diabetic neuropathic arthropathy: Secondary | ICD-10-CM | POA: Diagnosis not present

## 2017-06-11 NOTE — Progress Notes (Signed)
He presents today for follow-up of his Charcot event left foot.  He states that he is doing much better today is much less warm.  He states that he did not get the calcitonin drug because it was too expensive.  Objective: Pulses are strongly palpable no open lesions or wounds the foot is just mildly warm overlying the dorsal lateral aspect.  He presents with his cam walker today.  Toenails are long thick yellow dystrophic clinically mycotic.  Assessment: Charcot arthropathy lessening of the most recent event.  Pain in limb secondary to onychomycosis diabetes.  Plan: I encouraged him to pick up the prescription but also his diabetic custom molded shoe should be here tomorrow and I will follow-up with him in about 3 weeks.  I did debride his painful mycotic nails today.

## 2017-06-18 ENCOUNTER — Ambulatory Visit: Payer: 59 | Admitting: Orthotics

## 2017-06-18 DIAGNOSIS — M79676 Pain in unspecified toe(s): Principal | ICD-10-CM

## 2017-06-18 DIAGNOSIS — M14672 Charcot's joint, left ankle and foot: Secondary | ICD-10-CM

## 2017-06-18 DIAGNOSIS — E11610 Type 2 diabetes mellitus with diabetic neuropathic arthropathy: Secondary | ICD-10-CM

## 2017-06-18 DIAGNOSIS — B351 Tinea unguium: Secondary | ICD-10-CM

## 2017-06-18 NOTE — Progress Notes (Signed)
Patient came in today to pick  Up custom-made shoes; however the left one was way too big and must go back to [facility] for adjustment.

## 2017-06-25 ENCOUNTER — Other Ambulatory Visit: Payer: Self-pay | Admitting: *Deleted

## 2017-06-25 ENCOUNTER — Telehealth: Payer: Self-pay | Admitting: Internal Medicine

## 2017-06-25 MED ORDER — GLYBURIDE 5 MG PO TABS: ORAL_TABLET | ORAL | 5 refills | Status: DC

## 2017-06-25 MED ORDER — LISINOPRIL 10 MG PO TABS: 10.0000 mg | ORAL_TABLET | Freq: Every day | ORAL | 5 refills | Status: DC

## 2017-06-25 NOTE — Telephone Encounter (Signed)
Copied from CRM [phone]. Topic: Inquiry >> Jun 25, 2017  9:27 AM Yvonna Alanisobinson, Andra M wrote: Reason for CRM: Patient called requesting refills of GlyBURIDE (DIABETA) 5 MG tablet and Lisinopril (PRINIVIL,ZESTRIL) 10 MG tablet. Patient needs refills ASAP. [facility] [address], [place] - [address] [phone] (Phone) [phone] (Fax).

## 2017-06-26 ENCOUNTER — Other Ambulatory Visit: Payer: Self-pay | Admitting: Emergency Medicine

## 2017-06-26 MED ORDER — GLYBURIDE 5 MG PO TABS: ORAL_TABLET | ORAL | 5 refills | Status: DC

## 2017-07-07 ENCOUNTER — Ambulatory Visit (INDEPENDENT_AMBULATORY_CARE_PROVIDER_SITE_OTHER): Payer: 59

## 2017-07-07 ENCOUNTER — Encounter: Payer: Self-pay | Admitting: Podiatry

## 2017-07-07 ENCOUNTER — Ambulatory Visit (INDEPENDENT_AMBULATORY_CARE_PROVIDER_SITE_OTHER): Payer: 59 | Admitting: Podiatry

## 2017-07-07 DIAGNOSIS — E11610 Type 2 diabetes mellitus with diabetic neuropathic arthropathy: Secondary | ICD-10-CM

## 2017-07-07 NOTE — Progress Notes (Signed)
He presents today for follow-up of his Charcot deformity states that is feeling pretty good.  Presents utilizing his Cam walker.  Objective: Vital signs are stable alert and oriented x3.  Pulses are palpable.  Much decrease in edema from previous visits.  Radiographs taken today demonstrate diffuse consolidation of the tarsometatarsal region laterally.  Plantarly no open lesions or wounds surprisingly but he does have a very large prominent cuboid.  Assessment: Charcot deformity with consolidation phase.  Plan: Continue use of the Cam walker I will follow-up with him in another month for another set of x-rays.

## 2017-08-04 ENCOUNTER — Encounter: Payer: 59 | Admitting: Podiatry

## 2017-08-06 ENCOUNTER — Ambulatory Visit: Payer: 59 | Admitting: Orthotics

## 2017-08-06 ENCOUNTER — Ambulatory Visit (INDEPENDENT_AMBULATORY_CARE_PROVIDER_SITE_OTHER): Payer: 59

## 2017-08-06 ENCOUNTER — Ambulatory Visit (INDEPENDENT_AMBULATORY_CARE_PROVIDER_SITE_OTHER): Payer: 59 | Admitting: Podiatry

## 2017-08-06 ENCOUNTER — Encounter: Payer: Self-pay | Admitting: Podiatry

## 2017-08-06 DIAGNOSIS — M79676 Pain in unspecified toe(s): Secondary | ICD-10-CM | POA: Diagnosis not present

## 2017-08-06 DIAGNOSIS — B351 Tinea unguium: Secondary | ICD-10-CM

## 2017-08-06 DIAGNOSIS — E11610 Type 2 diabetes mellitus with diabetic neuropathic arthropathy: Secondary | ICD-10-CM | POA: Diagnosis not present

## 2017-08-06 DIAGNOSIS — M14672 Charcot's joint, left ankle and foot: Secondary | ICD-10-CM

## 2017-08-06 NOTE — Progress Notes (Signed)
Sending shoes BACK again to [facility] for adjustments

## 2017-08-08 NOTE — Progress Notes (Signed)
He presents today for follow-up of his Charcot deformity of his left foot.  He states that he seems to be doing okay.  He was also [place] but they did not fit so they will have to be remained once again.  He also is complaining of a painful elongated toenails.  States his blood sugars been doing well.  Objective: Vital signs are stable he is alert and oriented x3.  Pulses are palpable.  As edema subsides to the left foot is obvious that he has a rocker-bottom foot at this point on the lateral aspect with a collapse of the fourth fifth tarsometatarsal joints and a very prominent cuboid articulation bilaterally left greater than right.  Radiographs of the left foot taken today demonstrate consolidation of the Charcot along the tarsometatarsal joints.  Much decrease in edema.  Currently no areas of warmth indicating high metabolic processes.  Appears to be doing much better at this point.  Toenails are long thick yellow dystrophic-like mycotic no open lesions or wounds are noted at this time.  Assessment: Pain in limb secondary onychomycosis diabetic peripheral neuropathy and Charcot arthropathy.  Plan: Discussed etiology pathology conservative versus surgical therapies.  At this point we are still trying to get him into a pair of custom built shoes to help prevent and what is certainly to be an issue of skin breakdown and ulceration in the future.  Also debrided his nails for him today we will follow-up with him in the next month or so.

## 2017-08-13 ENCOUNTER — Telehealth: Payer: Self-pay

## 2017-08-13 ENCOUNTER — Telehealth: Payer: Self-pay | Admitting: Podiatry

## 2017-08-13 NOTE — Telephone Encounter (Signed)
I saw Dr. Al CorpusHyatt and Raiford Nobleick last week. My shoes were in and when I went to pick them up we ended up having to send them back. I was wondering if I receive another boot because this boot is wearing out. It is starting to get holes in the bottom, and the Velcro isn't sticking so I'm not getting the full support that I need from the boot. If you would give me a return call at [phone]. Thank you.

## 2017-08-13 NOTE — Telephone Encounter (Signed)
Spoke with patient advising him to come pick up another boot at our office

## 2017-08-31 NOTE — Progress Notes (Signed)
This encounter was created in error - please disregard.

## 2017-09-03 ENCOUNTER — Ambulatory Visit (INDEPENDENT_AMBULATORY_CARE_PROVIDER_SITE_OTHER): Payer: 59 | Admitting: Podiatry

## 2017-09-03 DIAGNOSIS — E11610 Type 2 diabetes mellitus with diabetic neuropathic arthropathy: Secondary | ICD-10-CM | POA: Diagnosis not present

## 2017-09-05 NOTE — Progress Notes (Signed)
He presents today for follow-up and evaluation of his Charcot deformity left.  Objective: Vital signs are stable he is alert and oriented x3 pulses are strongly palpable.  Neurologic sensorium is diminished per Semmes Weinstein monofilament.  No open lesions or wounds it appears that the traumas to the plantar aspect of the left foot appears to be resolving to some degree.  Though he is stable to remain hypertrophic in this area secondary to the Charcot deformity.  Both feet demonstrate no open lesions or wounds.  Assessment: Diabetes with diabetic peripheral neuropathy and Charcot deformities.  Plan: Discussed etiology pathology conservative surgical therapies he would like to continue to follow-up with us every month to 6 weeks just for evaluation until he we can gain his access to his diabetic shoes.  Shoes are not and again today.

## 2017-09-07 ENCOUNTER — Ambulatory Visit: Payer: 59 | Admitting: Internal Medicine

## 2017-10-01 ENCOUNTER — Encounter: Payer: Self-pay | Admitting: Podiatry

## 2017-10-01 ENCOUNTER — Ambulatory Visit (INDEPENDENT_AMBULATORY_CARE_PROVIDER_SITE_OTHER): Payer: 59 | Admitting: Podiatry

## 2017-10-01 DIAGNOSIS — E11610 Type 2 diabetes mellitus with diabetic neuropathic arthropathy: Secondary | ICD-10-CM

## 2017-10-01 DIAGNOSIS — B351 Tinea unguium: Secondary | ICD-10-CM | POA: Diagnosis not present

## 2017-10-01 DIAGNOSIS — B353 Tinea pedis: Secondary | ICD-10-CM | POA: Diagnosis not present

## 2017-10-01 DIAGNOSIS — M79676 Pain in unspecified toe(s): Secondary | ICD-10-CM | POA: Diagnosis not present

## 2017-10-01 MED ORDER — NAFTIFINE HCL 2 % EX GEL: 1.0000 "application " | Freq: Two times a day (BID) | CUTANEOUS | 2 refills | Status: DC

## 2017-10-01 MED ORDER — KETOCONAZOLE 2 % EX CREA: 1.0000 "application " | TOPICAL_CREAM | Freq: Two times a day (BID) | CUTANEOUS | 2 refills | Status: AC

## 2017-10-01 NOTE — Addendum Note (Signed)
Addended by: Kristian CoveyPREVETTE, Korey Prashad E on: 10/01/2017 05:23 PM   Modules accepted: Orders

## 2017-10-01 NOTE — Progress Notes (Signed)
He presents today chief complaint of painful elongated toenails she is also stating that he has a slight area of maceration between the first and second digits of the right foot.  He is concerned that this may be coming from swimming and not being able to dry his feet very well.  He denies any fever chills nausea vomiting muscle aches or pains.  Objective: Vital signs are stable alert oriented x3.  Pulses are palpable.  Toenails are long thick yellow dystrophic-like mycotic he also has slight maceration with slight laceration between the first and second digits of the right foot.  The toes are tightly together and moist in between.  I see no signs of bacterial infection though it does look like it may be a fungal infection in this area.  Assessment: InterDigital tinea pedis with maceration first interdigital space of the right foot as well as Charcot deformity bilateral.  Pain in limb secondary to onychomycosis.  Plan: Debridement of toenails 1 through 5 bilateral.  Wrote a prescription for Naftin gel to be applied to the toe area once or twice daily.  I recommended that he make sure he do that after he is swimming.  I also recommended that he not swim until the laceration has healed 100%.  I will follow-up with him in the near future.  Most likely we should see him within the next 1-2 weeks

## 2017-10-02 ENCOUNTER — Telehealth: Payer: Self-pay | Admitting: *Deleted

## 2017-10-02 MED ORDER — NONFORMULARY OR COMPOUNDED ITEM: 2 refills | Status: DC

## 2017-10-02 NOTE — Telephone Encounter (Signed)
Left message informing pt we would send a rx to [facility] [phone] and they would contact with cost and deliver of the cream. [facility] orders to [facility].

## 2017-10-02 NOTE — Telephone Encounter (Signed)
Fax stating pt does not have insurance and will cost $596.20.

## 2017-10-03 IMAGING — MR MR FOOT*R* W/O CM
3 of 5 series · 8 of 40 positions shown · non-contrast
Comparison: 01/24/2016

CLINICAL DATA: Right medial ankle pain and swelling for 4 months.
Charcot foot. Diabetic neuropathy.

EXAM:
MRI OF THE RIGHT FOREFOOT WITHOUT CONTRAST
TECHNIQUE: Multiplanar, multisequence MR imaging of the ankle was performed. No
intravenous contrast was administered.

[Series 3: PD fat-sat · axial · right · 4.0mm · 0.21mm/px · z∈[-83,+51]mm · 3 of 40 slices shown]
[im 6/40]
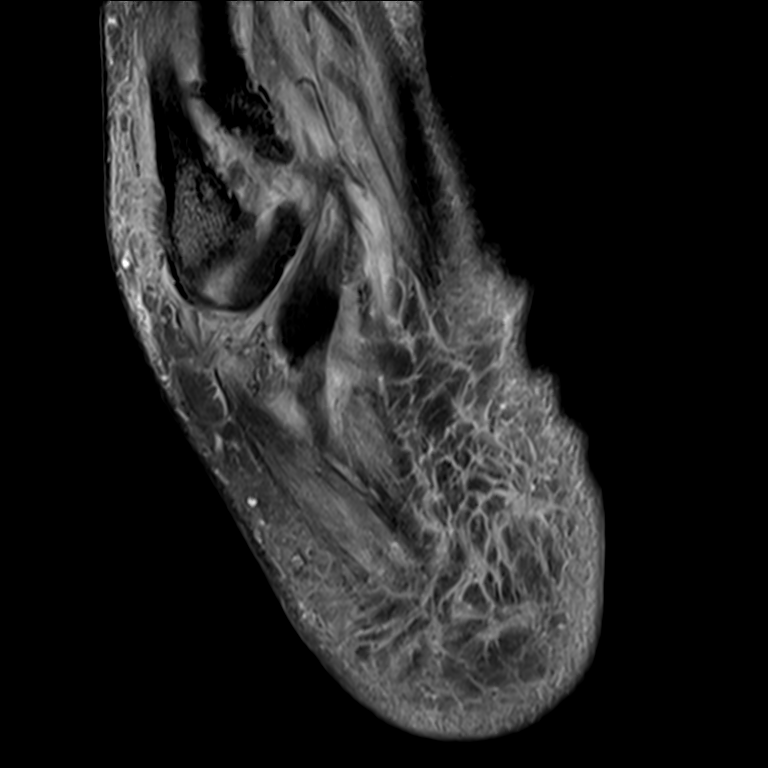
[im 23/40]
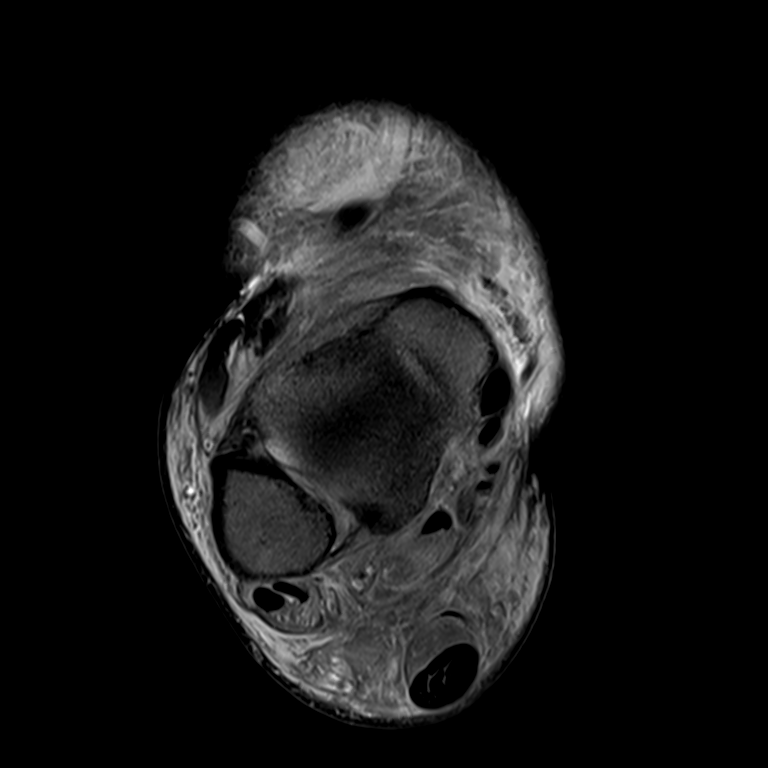
[im 34/40]
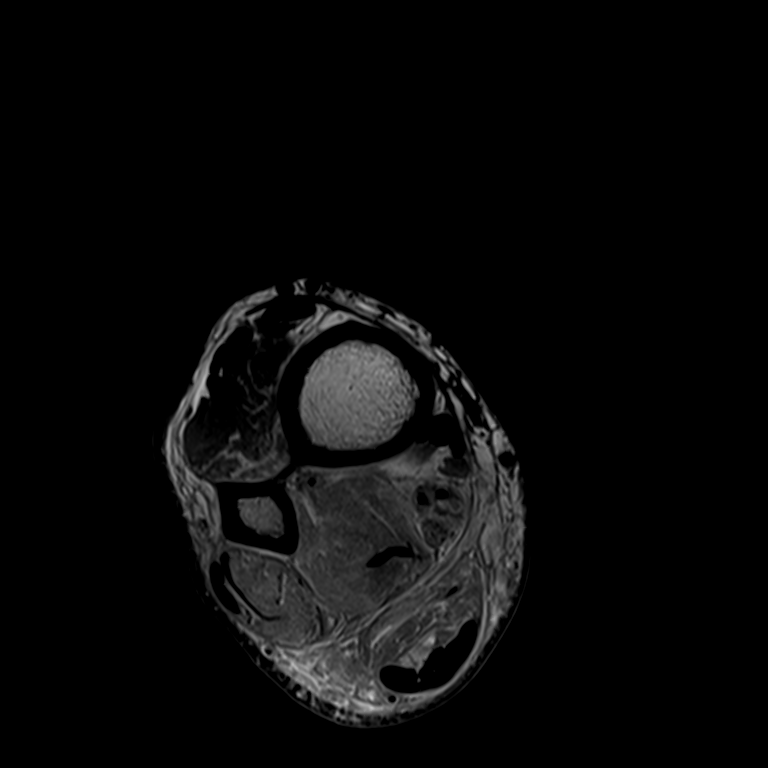

[Series 4: T2 fat-sat · axial · right · 4.0mm · 0.21mm/px · z∈[-88,+56]mm · 3 of 40 slices shown (1 of 2)]
[im 5/40]
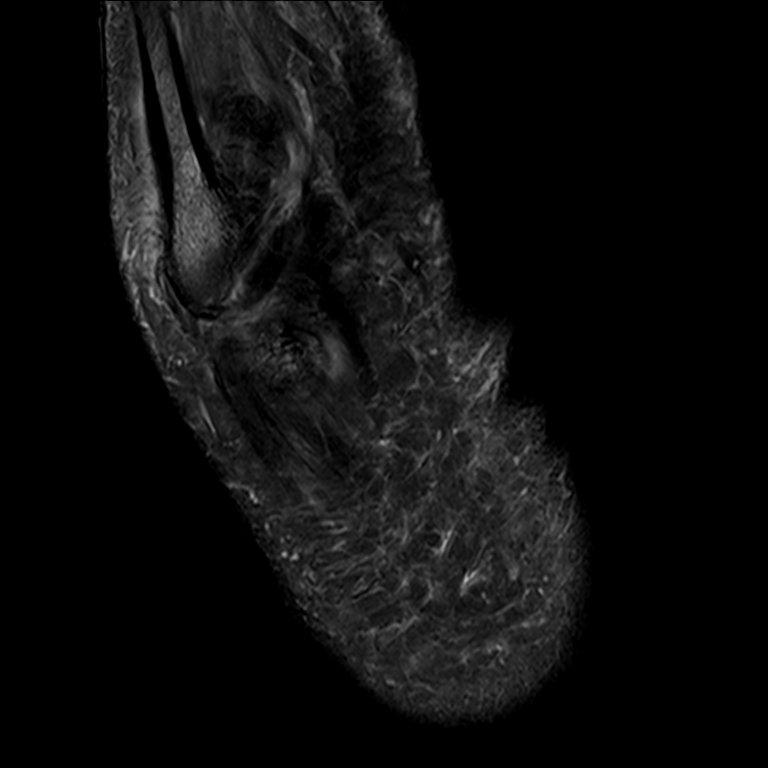
[im 20/40]
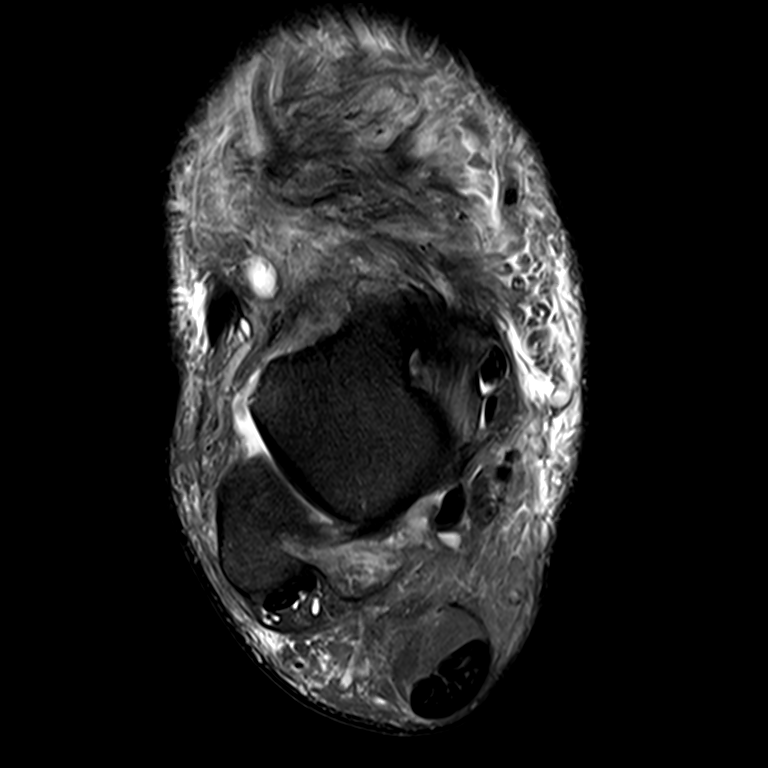
[im 35/40]
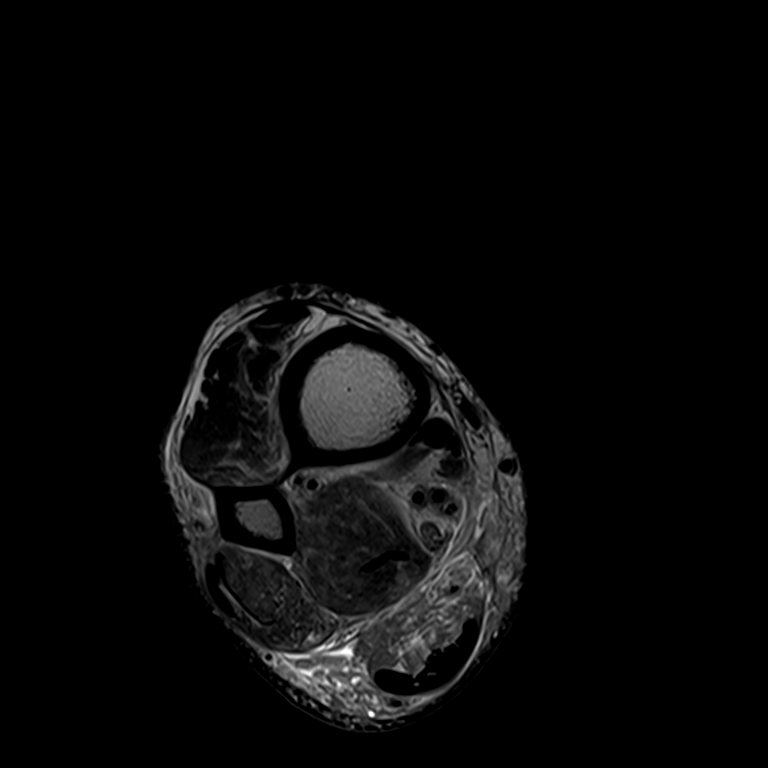

[Series 5: T2 fat-sat · sagittal · right · 3.0mm · 0.23mm/px · 2 of 30 slices shown (2 of 2)]
[im 5/30]
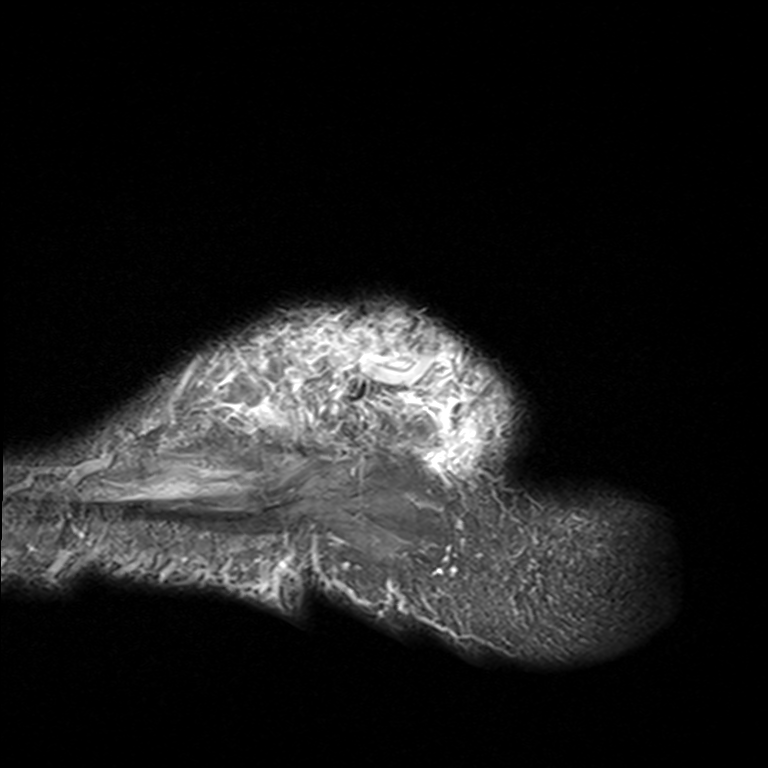
[im 15/30]
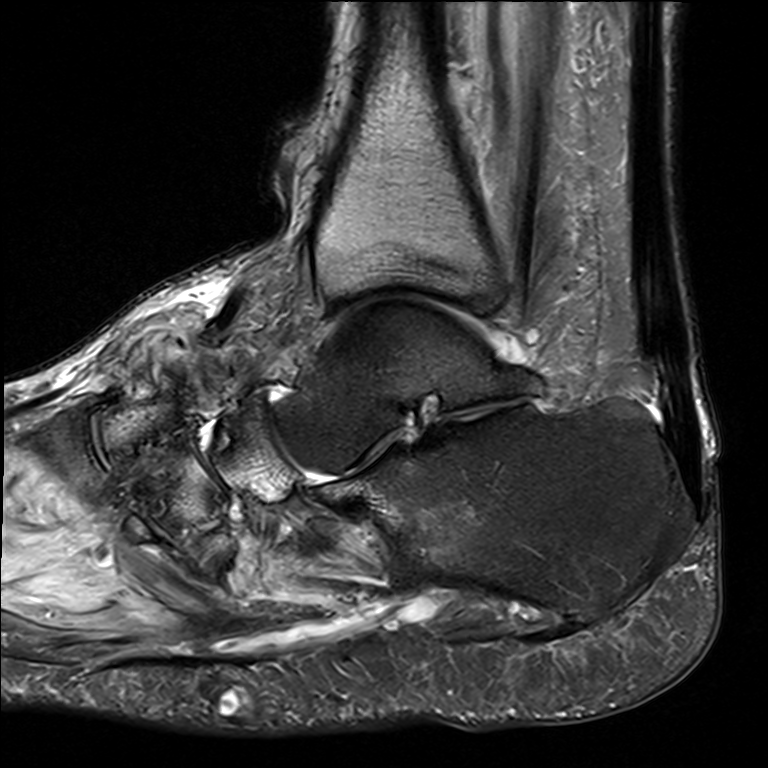

[8 of 40 positions shown; findings below may reference images not displayed]

FINDINGS: TENDONS

Peroneal: Distal peroneus longus tendinopathy. Anomalous investiture
of the peroneus longus by tendon arising from the lateral plantar
calcaneus, image [DATE].

Posteromedial: Distal tibialis posterior tendinopathy.

Anterior: Unremarkable

Achilles: Distal Achilles tendinopathy with increased signal and
fusiform enlargement near the insertion.

Plantar Fascia: Abnormal thickening of the medial band of the
plantar fascia proximally. Thickening of the lateral band of the
plantar fascia distally. A component of this is likely due to
plantar fasciitis although fibromatosis is not excluded.

LIGAMENTS

Lateral: Unremarkable

Medial: Attenuated tibiospring and tibionavicular portions of
deltoid ligament.

CARTILAGE

Ankle Joint: Unremarkable

Subtalar Joints/Sinus Tarsi: Low-level edema in the sinus tarsi
without ligamentous disruption.

Bones: Extensive midfoot fragmentation, edema, and bony resorption
characteristic of Charcot arthropathy. Abnormal edema involves the
talus, cuneiform is a, cuboid, and bases of the metatarsals, with
fragmentation most especially prominent in the cuneiform is a, and
dorsal displacement of fragments. Highly irregular Lisfranc joint
with indistinct Lisfranc ligament. There is edema in the extensor
digitorum brevis and tracking along the dorsum of the foot. I do not
see any definite draining sinus tracks.

Other: No supplemental non-categorized findings.
IMPRESSION: 1. Extensive midfoot Charcot arthropathy with fragmentation,
fracture, and extensive edema involving the bones of the midfoot and
metatarsal bases. Surrounding soft tissue edema noted. I do not see
any draining sinus tract or obvious abscess to suggest infection,
but correlate with clinical signs of infection.
2. Attenuated tibiospring and tibionavicular portions of the deltoid
ligament, likely torn.
3. Distal tibialis posterior tendinopathy may reflect tibialis
posterior dysfunction.
4. Distal Achilles tendinopathy.
5. Extensive thickening of the medial band of the plantar fascia
proximally. There is also some thickening of the lateral band of the
plantar fascia distally. Although probably from plantar fasciitis, a
component of plantar fibromatosis is not excluded.
6. Incidental anomalous investiture of the peroneus longus by tendon
arising from the lateral plantar calcaneus.

## 2017-10-06 ENCOUNTER — Other Ambulatory Visit: Payer: 59 | Admitting: Orthotics

## 2017-10-06 ENCOUNTER — Ambulatory Visit: Payer: 59 | Admitting: Podiatry

## 2017-11-03 ENCOUNTER — Ambulatory Visit (INDEPENDENT_AMBULATORY_CARE_PROVIDER_SITE_OTHER): Payer: 59 | Admitting: Podiatry

## 2017-11-03 ENCOUNTER — Encounter: Payer: Self-pay | Admitting: Podiatry

## 2017-11-03 ENCOUNTER — Ambulatory Visit: Payer: 59 | Admitting: Orthotics

## 2017-11-03 DIAGNOSIS — B351 Tinea unguium: Secondary | ICD-10-CM

## 2017-11-03 DIAGNOSIS — M79676 Pain in unspecified toe(s): Secondary | ICD-10-CM

## 2017-11-03 DIAGNOSIS — M1460 Charcot's joint, unspecified site: Secondary | ICD-10-CM

## 2017-11-03 DIAGNOSIS — E11610 Type 2 diabetes mellitus with diabetic neuropathic arthropathy: Secondary | ICD-10-CM | POA: Diagnosis not present

## 2017-11-03 NOTE — Progress Notes (Signed)
He presents today for follow-up of his Charcot deformity of his left foot.  He also like a nail trim.  Objective: Vital signs are stable he is alert and oriented x3 there is no erythema edema cellulitis drainage or odor no skin breakdown.  Mild thickening of the callus beneath the cuboid left foot.  I debrided this some today.  His toenails are elongated thickened dystrophic and painful.  Assessment: Pain in limb secondary to onychomycosis and Charcot deformity.  Plan: Debridement of toenails reactive hyperkeratosis bilateral.

## 2017-11-03 NOTE — Progress Notes (Signed)
Patient came in today to p/up custom shoes to address [employer]cot (L); due to fact we have had to send back to [facility] we reduced price to 350.  Patient did not have 350 so we did not dispense.

## 2017-11-06 ENCOUNTER — Telehealth: Payer: Self-pay | Admitting: *Deleted

## 2017-11-06 NOTE — Telephone Encounter (Signed)
Pt states he was seen 11/03/2017 and Dr. Al Corpus was going to update a letter for him and he wanted to know if it was ready. I told pt it was and I would have it ready at the front reception desk for pick up.

## 2017-12-06 ENCOUNTER — Emergency Department (HOSPITAL_COMMUNITY): Payer: Self-pay

## 2017-12-06 ENCOUNTER — Encounter (HOSPITAL_COMMUNITY): Payer: Self-pay | Admitting: Emergency Medicine

## 2017-12-06 ENCOUNTER — Other Ambulatory Visit: Payer: Self-pay

## 2017-12-06 ENCOUNTER — Emergency Department (HOSPITAL_COMMUNITY)
Admission: EM | Admit: 2017-12-06 | Discharge: 2017-12-06 | Disposition: A | Discharge status: Home / Self Care | Payer: Self-pay | Attending: Emergency Medicine | Admitting: Emergency Medicine

## 2017-12-06 DIAGNOSIS — Z7984 Long term (current) use of oral hypoglycemic drugs: Secondary | ICD-10-CM | POA: Insufficient documentation

## 2017-12-06 DIAGNOSIS — E11319 Type 2 diabetes mellitus with unspecified diabetic retinopathy without macular edema: Secondary | ICD-10-CM | POA: Insufficient documentation

## 2017-12-06 DIAGNOSIS — E1140 Type 2 diabetes mellitus with diabetic neuropathy, unspecified: Secondary | ICD-10-CM | POA: Insufficient documentation

## 2017-12-06 DIAGNOSIS — R1084 Generalized abdominal pain: Secondary | ICD-10-CM | POA: Insufficient documentation

## 2017-12-06 DIAGNOSIS — I10 Essential (primary) hypertension: Secondary | ICD-10-CM | POA: Insufficient documentation

## 2017-12-06 DIAGNOSIS — R1011 Right upper quadrant pain: Secondary | ICD-10-CM | POA: Insufficient documentation

## 2017-12-06 LAB — COMPREHENSIVE METABOLIC PANEL
ALT: 20 U/L (ref 17–63)
AST: 17 U/L (ref 15–41)
Albumin: 3.7 g/dL (ref 3.5–5.0)
Alkaline Phosphatase: 78 U/L (ref 38–126)
Anion gap: 8 (ref 5–15)
BUN: 13 mg/dL (ref 6–20)
CHLORIDE: 108 mmol/L (ref 101–111)
CO2: 25 mmol/L (ref 22–32)
CREATININE: 1.35 mg/dL — AB (ref 0.61–1.24)
Calcium: 9.2 mg/dL (ref 8.9–10.3)
GFR calc Af Amer: >60 mL/min (ref >60)
Glucose, Bld: 105 mg/dL — ABNORMAL HIGH (ref 65–99)
Potassium: 3.6 mmol/L (ref 3.5–5.1)
Sodium: 141 mmol/L (ref 135–145)
Total Bilirubin: 0.6 mg/dL (ref 0.3–1.2)
Total Protein: 7.7 g/dL (ref 6.5–8.1)

## 2017-12-06 LAB — URINALYSIS, ROUTINE W REFLEX MICROSCOPIC
Bilirubin Urine: NEGATIVE
GLUCOSE, UA: NEGATIVE mg/dL
Hgb urine dipstick: NEGATIVE
KETONES UR: 5 mg/dL — AB
Leukocytes, UA: NEGATIVE
Nitrite: NEGATIVE
PROTEIN: NEGATIVE mg/dL
Specific Gravity, Urine: 1.02 (ref 1.005–1.030)
pH: 5 (ref 5.0–8.0)

## 2017-12-06 LAB — LIPASE, BLOOD: LIPASE: 24 U/L (ref 11–51)

## 2017-12-06 LAB — CBC
HCT: 46.1 % (ref 39.0–52.0)
Hemoglobin: 15.6 g/dL (ref 13.0–17.0)
MCH: 30 pg (ref 26.0–34.0)
MCHC: 33.8 g/dL (ref 30.0–36.0)
MCV: 88.7 fL (ref 78.0–100.0)
PLATELETS: 261 10*3/uL (ref 150–400)
RBC: 5.2 MIL/uL (ref 4.22–5.81)
RDW: 12.4 % (ref 11.5–15.5)
WBC: 11.1 10*3/uL — ABNORMAL HIGH (ref 4.0–10.5)

## 2017-12-06 MED ORDER — DICYCLOMINE HCL 20 MG PO TABS: 20.0000 mg | ORAL_TABLET | Freq: Two times a day (BID) | ORAL | 0 refills | Status: AC

## 2017-12-06 MED ORDER — DICYCLOMINE HCL 10 MG PO CAPS
10.0000 mg | ORAL_CAPSULE | Freq: Once | ORAL | Status: AC
  Administered 2017-12-06: 10 mg via ORAL
  Filled 2017-12-06: qty 1

## 2017-12-06 MED ORDER — GI COCKTAIL ~~LOC~~
30.0000 mL | Freq: Once | ORAL | Status: AC
  Administered 2017-12-06: 30 mL via ORAL
  Filled 2017-12-06: qty 30

## 2017-12-06 MED ORDER — IOHEXOL 300 MG/ML  SOLN
100.0000 mL | Freq: Once | INTRAMUSCULAR | Status: AC | PRN
  Administered 2017-12-06: 100 mL via INTRAVENOUS

## 2017-12-06 NOTE — ED Notes (Signed)
Patient returned from CT

## 2017-12-06 NOTE — ED Triage Notes (Signed)
Pt states he has had stomach cramping and diarrhea after eating each time. States it has been intermittent. Had treatment per pt about a year ago. Has been bothering him the last 2-3 weeks.

## 2017-12-06 NOTE — ED Provider Notes (Signed)
Assumed care from Dr. Clayborne DanaMesner at 8 AM.  Patient with 1 year of intermittent abdominal pain and diarrhea.  However over the last 2 weeks worsening pain in the epigastric area.  Significant tenderness on exam on initial presentation.  Currently patient is still having mild to moderate epigastric and slight right upper quadrant tenderness.  CT is showing distention of the gallbladder of uncertain etiology as well as nonspecific external iliac adenopathy.  Patient has no prior history of malignancy and discussed with him the the need for a CT in 6 months just to reevaluate.  However given patient is still having discomfort in this area we will do a right upper quadrant ultrasound to ensure no gallbladder pathology.  11:27 AM Sound is negative for acute pathology.  Patient given a prescription for Bentyl and GI follow-up.   Gwyneth SproutPlunkett, Analyse Angst, MD 12/06/17 [phone]

## 2017-12-08 ENCOUNTER — Ambulatory Visit (INDEPENDENT_AMBULATORY_CARE_PROVIDER_SITE_OTHER): Payer: 59 | Admitting: Podiatry

## 2017-12-08 ENCOUNTER — Encounter: Payer: Self-pay | Admitting: Podiatry

## 2017-12-08 DIAGNOSIS — E11610 Type 2 diabetes mellitus with diabetic neuropathic arthropathy: Secondary | ICD-10-CM

## 2017-12-08 DIAGNOSIS — E1142 Type 2 diabetes mellitus with diabetic polyneuropathy: Secondary | ICD-10-CM

## 2017-12-08 DIAGNOSIS — B351 Tinea unguium: Secondary | ICD-10-CM

## 2017-12-08 DIAGNOSIS — M79676 Pain in unspecified toe(s): Secondary | ICD-10-CM | POA: Diagnosis not present

## 2017-12-08 NOTE — Progress Notes (Signed)
He presents today wearing his new diabetic custom molded shoes.  He states that everything seems to be doing pretty good.  His toenails are long and painful.  He stated that he noticed some swelling and some redness to the right foot but it has seemed to come and go over the past week but currently it is cool to the touch.  Objective: Pulses are strong and palpable.  No change in neurologic sensation.  No open lesions or wounds.  Toenails are long thick yellow dystrophic with mycotic.  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  Pain in limb secondary to onychomycosis.  Plan: Debrided all reactive hyperkeratosis debrided toenails 1 through 5 bilateral.

## 2017-12-09 NOTE — ED Provider Notes (Signed)
[facility] EMERGENCY DEPARTMENT Provider Note   CSN: [id number] Arrival date & time: 12/06/17  0116     History   Chief Complaint Chief Complaint  Patient presents with  . Abdominal Pain    HPI Randall Morton is a 47 y.o. male.  The history is provided by the patient.  Abdominal Pain   This is a chronic problem. The current episode started more than 1 week ago. The problem occurs constantly. The problem has been gradually worsening. The pain is associated with eating. The pain is mild. Pertinent negatives include anorexia, flatus and melena. Nothing aggravates the symptoms. Nothing relieves the symptoms.    Past Medical History:  Diagnosis Date  . Diabetes mellitus without complication ([place])   . 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 Diabetic retinopathy ([place])    last checked on 04/2014  . Hypertension   . Osteomyelitis ([place])    left foot ulcer--> osteo  . Polyneuropathy in diabetes [facility])     Patient Active Problem List   Diagnosis Date Noted  . Rash 08/05/2016  . Leg edema, right 08/05/2016  . 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 Low HDL (under 40) 12/15/2015  . Toe osteomyelitis, left ([place]) 07/11/2014  . Type II diabetes mellitus with manifestations ([place]) 08/26/2013  . Neuropathy due to secondary diabetes ([place]) 08/26/2013  . HTN (hypertension) 08/26/2013    Past Surgical History:  Procedure Laterality Date  . CARDIAC CATHETERIZATION N/A 12/17/2015   Procedure: Left Heart Cath and Coronary Angiography;  Surgeon: Josue Hector, MD;  Location: [facility];  Service: Cardiovascular;  Laterality: N/A;        Home Medications    Prior to Admission medications   Medication Sig Start Date End Date Taking? Authorizing Provider  furosemide (LASIX) 20 MG tablet Take 1 tablet (20 mg total) by mouth daily. 01/05/17  Yes Burns, Claudina Lick, MD  glyBURIDE (DIABETA) 5 MG tablet Take two tablets with breakfast and one tablet with  dinner Patient taking differently: Take 5-10 mg by mouth See admin instructions. Take two tablets with breakfast and one tablet with dinner 06/26/17  Yes Burns, Claudina Lick, MD  lisinopril (PRINIVIL,ZESTRIL) 10 MG tablet Take 1 tablet (10 mg total) by mouth daily. 06/25/17  Yes Burns, Claudina Lick, MD  Multiple Vitamins-Minerals (MULTIVITAMIN WITH MINERALS) tablet Take 1 tablet by mouth daily.   Yes [provider]  blood glucose meter kit and supplies KIT Dispense based on patient and insurance preference. Use up to four times daily as directed. (FOR ICD-9 250.00, 250.01). 02/05/16   Binnie Rail, MD  calcitonin, salmon, (MIACALCIN/FORTICAL) 200 UNIT/ACT nasal spray Place 1 spray into alternate nostrils daily. Patient not taking: Reported on 12/06/2017 06/05/17   Evelina Bucy, DPM  cyclobenzaprine (FLEXERIL) 10 MG tablet Take 1 tablet (10 mg total) by mouth 2 (two) times daily as needed for muscle spasms. Patient not taking: Reported on 12/06/2017 02/01/17   Ashley Murrain, NP  Diclofenac Sodium (PENNSAID) 2 % SOLN Place 1 application onto the skin 2 (two) times daily as needed. Patient not taking: Reported on 12/06/2017 09/16/16   Nche, Charlene Brooke, NP  dicyclomine (BENTYL) 20 MG tablet Take 1 tablet (20 mg total) by mouth 2 (two) times daily. 12/06/17   Blanchie Dessert, MD  empagliflozin (JARDIANCE) 10 MG TABS tablet Take 10 mg by mouth daily. Patient not taking: Reported on 12/06/2017 03/23/17   Binnie Rail, MD  ketoconazole (NIZORAL) 2 % cream Apply 1 application topically 2 (two) times daily. Patient not taking: Reported on 12/06/2017 10/01/17   Hyatt, Max T, DPM  Naftifine HCl 2 % GEL Apply 1 application topically 2 (two) times daily. Patient not taking: Reported on 12/06/2017 10/01/17   Garrel Ridgel, DPM  NONFORMULARY OR COMPOUNDED [place]:  Naftifine gel 3% - apply to affected area 2 times daily. Patient not taking: Reported on 12/06/2017 10/02/17   Garrel Ridgel, DPM    Family  History Family History  Problem Relation Age of Onset  . Asthma Mother   . Diabetes Father   . Heart disease Father   . Hyperlipidemia Father   . Hypertension Father   . Stroke Father   . Prostate cancer Paternal Grandfather   . Colon cancer Neg Hx   . Rectal cancer Neg Hx   . Stomach cancer Neg Hx   . Esophageal cancer Neg Hx   . Liver cancer Neg Hx     Social History Social History   Tobacco Use  . Smoking status: Never Smoker  . Smokeless tobacco: Never Used  Substance Use Topics  . Alcohol use: Yes    Alcohol/week: 0.0 oz    Comment: socially  . Drug use: No     Allergies   Metformin and related   Review of Systems Review of Systems  Gastrointestinal: Positive for abdominal pain. Negative for anorexia, flatus and melena.  All other systems reviewed and are negative.    Physical Exam Updated Vital Signs BP (!) 136/94   Pulse 90   Temp 98.1 F (36.7 C) (Oral)   Resp 16   Ht 6' 2"  (1.88 m)   Wt (!) 137 kg (302 lb)   SpO2 96%   BMI 38.77 kg/m   Physical Exam  Constitutional: He appears well-developed and well-nourished.  HENT:  Head: Normocephalic and atraumatic.  Eyes: Pupils are equal, round, and reactive to light. EOM are normal.  Neck: Normal range of motion.  Cardiovascular: Normal rate.  Pulmonary/Chest: Effort normal. No respiratory distress.  Abdominal: Soft. He exhibits no distension. There is tenderness (epigastric).  Musculoskeletal: Normal range of motion.  Neurological: He is alert.  Skin: Skin is warm and dry. No rash noted. No erythema.  Nursing note and vitals reviewed.    ED Treatments / Results  Labs (all labs ordered are listed, but only abnormal results are displayed) Labs Reviewed  COMPREHENSIVE METABOLIC PANEL - Abnormal; Notable for the following components:      Result Value   Glucose, Bld 105 (*)    Creatinine, Ser 1.35 (*)    All other components within normal limits  CBC - Abnormal; Notable for the following  components:   WBC 11.1 (*)    All other components within normal limits  URINALYSIS, ROUTINE W REFLEX MICROSCOPIC - Abnormal; Notable for the following components:   Ketones, ur 5 (*)    All other components within normal limits  LIPASE, BLOOD    EKG None  Radiology No results found.  Procedures Procedures (including critical care time)  Medications Ordered in ED Medications  gi cocktail (Maalox,Lidocaine,Donnatal) (30 mLs Oral Given 12/06/17 0629)  dicyclomine (BENTYL) capsule 10 mg (10 mg Oral Given 12/06/17 0629)  iohexol (OMNIPAQUE) 300 MG/ML solution 100 mL (100 mLs Intravenous Contrast Given 12/06/17 0704)     Initial Impression / Assessment and Plan / ED Course  I have reviewed the triage vital signs and the nursing notes.  Pertinent labs & imaging results that were available during my care of the patient were reviewed by me and considered in my medical decision making (see chart for details).     Ct to rule out pancreatitis. Doubt GB. Could be malignancy/perforated ulcer/gastric outlet obstruction.   Care to Dr. Maryan Rued pending CT scan.   Final Clinical Impressions(s) / ED Diagnoses   Final diagnoses:  RUQ pain  Generalized abdominal pain    ED Discharge Orders        Ordered    dicyclomine (BENTYL) 20 MG tablet  2 times daily     12/06/17 1125       Morayo Leven, Corene Cornea, MD 12/09/17 [phone]

## 2018-01-19 ENCOUNTER — Encounter: Payer: Self-pay | Admitting: Podiatry

## 2018-01-19 ENCOUNTER — Ambulatory Visit (INDEPENDENT_AMBULATORY_CARE_PROVIDER_SITE_OTHER): Payer: 59 | Admitting: Podiatry

## 2018-01-19 DIAGNOSIS — E11610 Type 2 diabetes mellitus with diabetic neuropathic arthropathy: Secondary | ICD-10-CM | POA: Diagnosis not present

## 2018-01-19 DIAGNOSIS — E1142 Type 2 diabetes mellitus with diabetic polyneuropathy: Secondary | ICD-10-CM

## 2018-01-19 DIAGNOSIS — B351 Tinea unguium: Secondary | ICD-10-CM | POA: Diagnosis not present

## 2018-01-19 DIAGNOSIS — M79676 Pain in unspecified toe(s): Secondary | ICD-10-CM

## 2018-01-19 NOTE — Progress Notes (Signed)
He presents today for follow-up of Charcot deformity and his diabetic foot care.  States that his nails are long and would like to have them cut.  Denies any open lesions or wounds.  Objective: Pulses remain palpable.  No neurological changes.  Skin is not open.  Toenails are long thick yellow dystrophic-like mycotic.  Assessment: Diabetic peripheral neuropathy with history of Charcot arthropathy.  Pain in limb secondary to onychomycosis.  Plan: Debridement of toenails 1 through 5 bilateral.

## 2018-02-23 ENCOUNTER — Telehealth: Payer: Self-pay | Admitting: Podiatry

## 2018-02-23 NOTE — Telephone Encounter (Signed)
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 Al Corpus know. I told him I would inform Dr. Al Corpus, and felt he could go to orientation and sit for it's duration and I would inform Dr. Al Corpus and have a note ready for him to pick up tomorrow.

## 2018-02-23 NOTE — Telephone Encounter (Signed)
Patient wanted to let Dr. Al Corpus know that he was going back to work at his part time job at [employer]. Patient says he just sits at a desk. Pt is currently going through disability. Pt. Would like a note.

## 2018-02-23 NOTE — Telephone Encounter (Signed)
Left message requesting a call back with information need for the note he requested.

## 2018-02-24 ENCOUNTER — Encounter: Payer: Self-pay | Admitting: Podiatry

## 2018-02-24 ENCOUNTER — Encounter: Payer: Self-pay | Admitting: *Deleted

## 2018-02-24 NOTE — Telephone Encounter (Signed)
I informed pt Dr. Maren Beach his return to [employer] at seated duties only, with limited standing. Pt states he will pick up the letter in about 15 minutes.

## 2018-02-24 NOTE — Telephone Encounter (Signed)
Just limit standing and walking

## 2018-03-02 ENCOUNTER — Ambulatory Visit (INDEPENDENT_AMBULATORY_CARE_PROVIDER_SITE_OTHER): Payer: 59 | Admitting: Podiatry

## 2018-03-02 ENCOUNTER — Encounter: Payer: Self-pay | Admitting: Podiatry

## 2018-03-02 DIAGNOSIS — E1142 Type 2 diabetes mellitus with diabetic polyneuropathy: Secondary | ICD-10-CM

## 2018-03-02 DIAGNOSIS — M79676 Pain in unspecified toe(s): Secondary | ICD-10-CM

## 2018-03-02 DIAGNOSIS — B351 Tinea unguium: Secondary | ICD-10-CM

## 2018-03-02 NOTE — Progress Notes (Signed)
He presents today states that my toenails need to be kept ingrown extra long.  He states that his diabetes is doing quite well has had no problem with his feet in the last few days.  Objective: Vital signs are stable he is alert and oriented x3.  Pulses are palpable.  Toenails are long thick yellow dystrophic with mycotic edema has decreased considerably there is no broken down tissue at all there is even very little in the way of reactive hyperkeratosis.  Assessment: Pain in limb secondary to onychomycosis.  Plan: Debridement of all reactive hyperkeratotic tissue debridement of toenails 1 through 5 bilateral.  Follow-up with me in 6 weeks

## 2018-04-20 ENCOUNTER — Ambulatory Visit (INDEPENDENT_AMBULATORY_CARE_PROVIDER_SITE_OTHER): Payer: 59 | Admitting: Podiatry

## 2018-04-20 ENCOUNTER — Encounter: Payer: Self-pay | Admitting: Podiatry

## 2018-04-20 DIAGNOSIS — M79676 Pain in unspecified toe(s): Secondary | ICD-10-CM | POA: Diagnosis not present

## 2018-04-20 DIAGNOSIS — B351 Tinea unguium: Secondary | ICD-10-CM

## 2018-04-20 NOTE — Progress Notes (Signed)
He presents today with diabetic peripheral neuropathy chief complaint of painful elongated toenails and Charcot deformities.  Objective: Vital signs are stable he is alert and oriented x3 no open lesions or wounds.  No changes in his deformities about his foot.  Toenails are long thick yellow dystrophic-like mycotic.  Assessment: Pain in limb secondary to onychomycosis and diabetic peripheral neuropathy.  Plan: Debridement of toenails 1 through 5 bilateral.

## 2018-05-04 ENCOUNTER — Telehealth: Payer: Self-pay | Admitting: Podiatry

## 2018-05-04 ENCOUNTER — Encounter: Payer: Self-pay | Admitting: Podiatry

## 2018-05-04 NOTE — Telephone Encounter (Signed)
Patient needs update letter saying that " I have not being released to return to work on a fulltime basis". I have to have a updated letter for my Child Support. I will pick, the letter up.

## 2018-05-05 ENCOUNTER — Encounter: Payer: Self-pay | Admitting: Podiatry

## 2018-05-07 ENCOUNTER — Encounter: Payer: Self-pay | Admitting: Podiatry

## 2018-05-29 ENCOUNTER — Emergency Department (HOSPITAL_COMMUNITY): Payer: No Typology Code available for payment source

## 2018-05-29 ENCOUNTER — Emergency Department (HOSPITAL_COMMUNITY)
Admission: EM | Admit: 2018-05-29 | Discharge: 2018-05-29 | Disposition: A | Discharge status: Home / Self Care | Payer: No Typology Code available for payment source | Attending: Emergency Medicine | Admitting: Emergency Medicine

## 2018-05-29 ENCOUNTER — Other Ambulatory Visit: Payer: Self-pay

## 2018-05-29 ENCOUNTER — Encounter (HOSPITAL_COMMUNITY): Payer: Self-pay | Admitting: Emergency Medicine

## 2018-05-29 DIAGNOSIS — Y999 Unspecified external cause status: Secondary | ICD-10-CM | POA: Diagnosis not present

## 2018-05-29 DIAGNOSIS — Y939 Activity, unspecified: Secondary | ICD-10-CM | POA: Diagnosis not present

## 2018-05-29 DIAGNOSIS — Z79899 Other long term (current) drug therapy: Secondary | ICD-10-CM | POA: Insufficient documentation

## 2018-05-29 DIAGNOSIS — S0990XA Unspecified injury of head, initial encounter: Secondary | ICD-10-CM | POA: Diagnosis present

## 2018-05-29 DIAGNOSIS — E10319 Type 1 diabetes mellitus with unspecified diabetic retinopathy without macular edema: Secondary | ICD-10-CM | POA: Diagnosis not present

## 2018-05-29 DIAGNOSIS — Z7984 Long term (current) use of oral hypoglycemic drugs: Secondary | ICD-10-CM | POA: Diagnosis not present

## 2018-05-29 DIAGNOSIS — S060X0A Concussion without loss of consciousness, initial encounter: Secondary | ICD-10-CM | POA: Diagnosis not present

## 2018-05-29 DIAGNOSIS — Y92410 Unspecified street and highway as the place of occurrence of the external cause: Secondary | ICD-10-CM | POA: Insufficient documentation

## 2018-05-29 DIAGNOSIS — E11610 Type 2 diabetes mellitus with diabetic neuropathic arthropathy: Secondary | ICD-10-CM | POA: Diagnosis not present

## 2018-05-29 DIAGNOSIS — M545 Low back pain: Secondary | ICD-10-CM | POA: Diagnosis not present

## 2018-05-29 DIAGNOSIS — I10 Essential (primary) hypertension: Secondary | ICD-10-CM | POA: Diagnosis not present

## 2018-05-29 DIAGNOSIS — M25552 Pain in left hip: Secondary | ICD-10-CM | POA: Diagnosis not present

## 2018-05-29 DIAGNOSIS — M25551 Pain in right hip: Secondary | ICD-10-CM | POA: Diagnosis not present

## 2018-05-29 LAB — CBG MONITORING, ED: GLUCOSE-CAPILLARY: 212 mg/dL — AB (ref 70–99)

## 2018-05-29 MED ORDER — LISINOPRIL 10 MG PO TABS: 10.0000 mg | ORAL_TABLET | Freq: Every day | ORAL | 0 refills | Status: AC

## 2018-05-29 MED ORDER — GLYBURIDE 5 MG PO TABS: 5.0000 mg | ORAL_TABLET | ORAL | 0 refills | Status: DC

## 2018-05-29 MED ORDER — METHOCARBAMOL 500 MG PO TABS: 500.0000 mg | ORAL_TABLET | Freq: Every evening | ORAL | 0 refills | Status: AC | PRN

## 2018-05-29 MED ORDER — LIDOCAINE 5 % EX PTCH
1.0000 | MEDICATED_PATCH | CUTANEOUS | Status: DC
  Administered 2018-05-29: 1 via TRANSDERMAL
  Filled 2018-05-29: qty 1

## 2018-05-29 MED ORDER — NAPROXEN 375 MG PO TABS: 375.0000 mg | ORAL_TABLET | Freq: Two times a day (BID) | ORAL | 0 refills | Status: AC

## 2018-05-29 MED ORDER — LISINOPRIL 10 MG PO TABS
10.0000 mg | ORAL_TABLET | Freq: Once | ORAL | Status: AC
  Administered 2018-05-29: 10 mg via ORAL
  Filled 2018-05-29: qty 1

## 2018-05-29 MED ORDER — LIDOCAINE 5 % EX PTCH: 1.0000 | MEDICATED_PATCH | CUTANEOUS | 0 refills | Status: DC

## 2018-05-29 MED ORDER — ACETAMINOPHEN 325 MG PO TABS
650.0000 mg | ORAL_TABLET | Freq: Once | ORAL | Status: AC
  Administered 2018-05-29: 650 mg via ORAL
  Filled 2018-05-29: qty 2

## 2018-05-29 NOTE — ED Triage Notes (Addendum)
ALL TRIAGE NOTES ADDED BY A. Aeralyn Barna RN Pt added that he has a throbbing headache.

## 2018-05-29 NOTE — ED Notes (Signed)
[facility] 212 
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
## 2018-06-29 ENCOUNTER — Ambulatory Visit (INDEPENDENT_AMBULATORY_CARE_PROVIDER_SITE_OTHER): Payer: 59 | Admitting: Podiatry

## 2018-06-29 ENCOUNTER — Encounter: Payer: Self-pay | Admitting: Podiatry

## 2018-06-29 DIAGNOSIS — M79676 Pain in unspecified toe(s): Secondary | ICD-10-CM

## 2018-06-29 DIAGNOSIS — B351 Tinea unguium: Secondary | ICD-10-CM | POA: Diagnosis not present

## 2018-06-29 DIAGNOSIS — E1142 Type 2 diabetes mellitus with diabetic polyneuropathy: Secondary | ICD-10-CM | POA: Diagnosis not present

## 2018-06-29 NOTE — Progress Notes (Signed)
He presents today for diabetes diabetic peripheral neuropathy states that his hemoglobin A1c is 7.5 but he states that he started using compression bags for his extremities and states that he is really starting to feel good.  Objective: Vital signs are stable alert and oriented x3.  Pulses are palpable.  Toenails are long thick yellow dystrophic-like mycotic no open lesions or wounds.  Assessment: Diabetes mellitus diabetic peripheral neuropathy Charcot arthropathy and pain in limb secondary to onychomycosis.  Plan: Debridement of toenails 1 through 5 bilateral follow-up with him in 2 months.

## 2018-08-08 ENCOUNTER — Encounter (HOSPITAL_COMMUNITY): Payer: Self-pay | Admitting: *Deleted

## 2018-08-08 ENCOUNTER — Emergency Department (HOSPITAL_COMMUNITY): Payer: Self-pay

## 2018-08-08 ENCOUNTER — Emergency Department (HOSPITAL_COMMUNITY)
Admission: EM | Admit: 2018-08-08 | Discharge: 2018-08-08 | Disposition: A | Discharge status: Home / Self Care | Payer: Self-pay | Attending: Emergency Medicine | Admitting: Emergency Medicine

## 2018-08-08 ENCOUNTER — Other Ambulatory Visit: Payer: Self-pay

## 2018-08-08 DIAGNOSIS — I10 Essential (primary) hypertension: Secondary | ICD-10-CM | POA: Insufficient documentation

## 2018-08-08 DIAGNOSIS — Z79899 Other long term (current) drug therapy: Secondary | ICD-10-CM | POA: Insufficient documentation

## 2018-08-08 DIAGNOSIS — M79671 Pain in right foot: Secondary | ICD-10-CM | POA: Insufficient documentation

## 2018-08-08 DIAGNOSIS — E119 Type 2 diabetes mellitus without complications: Secondary | ICD-10-CM | POA: Insufficient documentation

## 2018-08-08 LAB — CBG MONITORING, ED: GLUCOSE-CAPILLARY: 277 mg/dL — AB (ref 70–99)

## 2018-08-08 MED ORDER — GLYBURIDE 5 MG PO TABS: 5.0000 mg | ORAL_TABLET | ORAL | Status: DC

## 2018-08-08 MED ORDER — OXYCODONE HCL 5 MG PO TABS
5.0000 mg | ORAL_TABLET | Freq: Once | ORAL | Status: AC
  Administered 2018-08-08: 5 mg via ORAL
  Filled 2018-08-08: qty 1

## 2018-08-08 MED ORDER — GLYBURIDE 5 MG PO TABS
10.0000 mg | ORAL_TABLET | Freq: Once | ORAL | Status: AC
  Administered 2018-08-08: 10 mg via ORAL
  Filled 2018-08-08: qty 2

## 2018-08-08 MED ORDER — LIDOCAINE 5 % EX PTCH: 1.0000 | MEDICATED_PATCH | CUTANEOUS | 0 refills | Status: AC

## 2018-08-08 MED ORDER — GLYBURIDE 5 MG PO TABS: 5.0000 mg | ORAL_TABLET | ORAL | 1 refills | Status: AC

## 2018-08-08 MED ORDER — ACETAMINOPHEN 325 MG PO TABS
650.0000 mg | ORAL_TABLET | Freq: Once | ORAL | Status: AC
  Administered 2018-08-08: 650 mg via ORAL
  Filled 2018-08-08: qty 2

## 2018-08-08 NOTE — ED Notes (Signed)
CBG 277  

## 2018-08-08 NOTE — Discharge Instructions (Addendum)
Follow-up with your foot and ankle doctor as well.  Return to the ED symptoms worsen.  Use her crutches and orthotic shoes

## 2018-08-08 NOTE — ED Notes (Signed)
Pt verbalized understanding of d/c instructions and has no further questions, VSS, nAD.  

## 2018-08-08 NOTE — ED Provider Notes (Signed)
[facility] EMERGENCY DEPARTMENT Provider Note   CSN: [id number] Arrival date & time: 08/08/18  6301     History   Chief Complaint Chief Complaint  Patient presents with  . Foot Pain    HPI SYAIR FRICKER is a 48 y.o. male.  The history is provided by the patient.  Foot Pain  This is a chronic problem. The current episode started 2 days ago. The problem occurs daily. The problem has been gradually worsening. Pertinent negatives include no headaches. The symptoms are aggravated by walking. Nothing relieves the symptoms. He has tried nothing for the symptoms. The treatment provided no relief.    Past Medical History:  Diagnosis Date  . Diabetes mellitus without complication ([place])   . 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 Diabetic retinopathy ([place])    last checked on 04/2014  . Hypertension   . Osteomyelitis ([place])    left foot ulcer--> osteo  . Polyneuropathy in diabetes [facility])     Patient Active Problem List   Diagnosis Date Noted  . Rash 08/05/2016  . Leg edema, right 08/05/2016  . 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 Low HDL (under 40) 12/15/2015  . Toe osteomyelitis, left ([place]) 07/11/2014  . Type II diabetes mellitus with manifestations ([place]) 08/26/2013  . Neuropathy due to secondary diabetes ([place]) 08/26/2013  . HTN (hypertension) 08/26/2013    Past Surgical History:  Procedure Laterality Date  . CARDIAC CATHETERIZATION N/A 12/17/2015   Procedure: Left Heart Cath and Coronary Angiography;  Surgeon: Josue Hector, MD;  Location: [facility];  Service: Cardiovascular;  Laterality: N/A;  . CARDIAC CATHETERIZATION    . charcot     neuropathy        Home Medications    Prior to Admission medications   Medication Sig Start Date End Date Taking? Authorizing Provider  blood glucose meter kit and supplies KIT Dispense based on patient and insurance preference. Use up to four times daily as directed. (FOR ICD-9 250.00,  250.01). 02/05/16   Binnie Rail, MD  dicyclomine (BENTYL) 20 MG tablet Take 1 tablet (20 mg total) by mouth 2 (two) times daily. 12/06/17   Blanchie Dessert, MD  furosemide (LASIX) 20 MG tablet Take 1 tablet (20 mg total) by mouth daily. 01/05/17   Binnie Rail, MD  glyBURIDE (DIABETA) 5 MG tablet Take 1-2 tablets (5-10 mg total) by mouth See admin instructions for 30 days. Take two tablets with breakfast and one tablet with dinner 08/08/18 09/07/18  Elfida Shimada, DO  ketoconazole (NIZORAL) 2 % cream Apply 1 application topically 2 (two) times daily. 10/01/17   Hyatt, Max T, DPM  lidocaine (LIDODERM) 5 % Place 1 patch onto the skin daily for 30 days. Remove & Discard patch within 12 hours or as directed by MD 08/08/18 09/07/18  Lennice Sites, DO  lisinopril (PRINIVIL,ZESTRIL) 10 MG tablet Take 1 tablet (10 mg total) by mouth daily. 05/29/18   Maczis, Barth Kirks, PA-C  methocarbamol (ROBAXIN) 500 MG tablet Take 1 tablet (500 mg total) by mouth at bedtime as needed for muscle spasms. 05/29/18   Maczis, Barth Kirks, PA-C  naproxen (NAPROSYN) 375 MG tablet Take 1 tablet (375 mg total) by mouth 2 (two) times daily. 05/29/18   Maczis, Barth Kirks, PA-C    Family History Family History  Problem Relation Age of Onset  . Asthma Mother   . Diabetes Father   . Heart disease Father   .  Hyperlipidemia Father   . Hypertension Father   . Stroke Father   . Prostate cancer Paternal Grandfather   . Colon cancer Neg Hx   . Rectal cancer Neg Hx   . Stomach cancer Neg Hx   . Esophageal cancer Neg Hx   . Liver cancer Neg Hx     Social History Social History   Tobacco Use  . Smoking status: Never Smoker  . Smokeless tobacco: Never Used  Substance Use Topics  . Alcohol use: Yes    Alcohol/week: 0.0 standard drinks    Comment: socially  . Drug use: No     Allergies   Metformin and related   Review of Systems Review of Systems  Musculoskeletal: Positive for arthralgias and gait problem. Negative for  back pain, joint swelling, myalgias, neck pain and neck stiffness.  Skin: Negative for color change, pallor, rash and wound.  Neurological: Negative for dizziness, tremors, seizures, syncope, facial asymmetry, speech difficulty, weakness, light-headedness, numbness and headaches.     Physical Exam Updated Vital Signs BP 138/90 (BP Location: Right Arm)   Pulse 80   Temp 97.8 F (36.6 C) (Oral)   Resp 16   SpO2 99%   Physical Exam Vitals signs and nursing note reviewed.  Constitutional:      General: He is not in acute distress.    Appearance: He is well-developed. He is not ill-appearing.  HENT:     Head: Normocephalic and atraumatic.     Nose: Nose normal.     Mouth/Throat:     Mouth: Mucous membranes are moist.  Eyes:     Extraocular Movements: Extraocular movements intact.     Conjunctiva/sclera: Conjunctivae normal.     Pupils: Pupils are equal, round, and reactive to light.  Neck:     Musculoskeletal: Neck supple.  Cardiovascular:     Rate and Rhythm: Normal rate and regular rhythm.     Pulses: Normal pulses.     Heart sounds: Normal heart sounds. No murmur.  Pulmonary:     Effort: Pulmonary effort is normal. No respiratory distress.     Breath sounds: Normal breath sounds.  Abdominal:     General: There is no distension.     Palpations: Abdomen is soft.     Tenderness: There is no abdominal tenderness.  Musculoskeletal: Normal range of motion.        General: No swelling or tenderness.     Right lower leg: No edema.     Left lower leg: No edema.  Skin:    General: Skin is warm and dry.     Capillary Refill: Capillary refill takes less than 2 seconds.  Neurological:     General: No focal deficit present.     Mental Status: He is alert and oriented to person, place, and time.     Cranial Nerves: No cranial nerve deficit.     Sensory: Sensory deficit (bilateral feet (baseline)) present.     Motor: No weakness.      ED Treatments / Results  Labs (all labs  ordered are listed, but only abnormal results are displayed) Labs Reviewed  CBG MONITORING, ED - Abnormal; Notable for the following components:      Result Value   Glucose-Capillary 277 (*)    All other components within normal limits    EKG None  Radiology Dg Ankle Complete Right  Result Date: 08/08/2018 CLINICAL DATA:  Pain and swelling EXAM: RIGHT ANKLE - COMPLETE 3+ VIEW COMPARISON:  Right foot radiographs  August 08, 2018 FINDINGS: Frontal, oblique, and lateral views were obtained. There is no evident acute fracture or joint effusion. There is mild osteoarthritic change medially. Ankle mortise appears intact. Note that there are Charcot changes in the midfoot, better appreciated on right foot radiographs. IMPRESSION: Charcot changes in midfoot. Mild osteoarthritic change in the medial right ankle joint. No fracture or joint effusion. Ankle mortise appears grossly intact. Electronically Signed   By: Lowella Grip III M.D.   On: 08/08/2018 08:24   Dg Foot Complete Right  Result Date: 08/08/2018 CLINICAL DATA:  Pain and swelling.  History of Charcot joint EXAM: RIGHT FOOT COMPLETE - 3+ VIEW COMPARISON:  Prior foot radiographs, most recent dated April 08, 2017 FINDINGS: There is extensive bony overgrowth with relative fragmentation in the midfoot region consistent with Charcot type changes. No acute appearing fracture or dislocation is evident. Distal joints appear unremarkable. There is pes planus. There is plantar fascia calcification, also noted previously. No bony destruction evident. IMPRESSION: Grossly stable Charcot changes throughout the midfoot with areas of bony overgrowth and old trauma with fragmentation. No acute appearing fracture or dislocation. There is pes planus. There is plantar fascia calcification. No frank bony destruction. Electronically Signed   By: Lowella Grip III M.D.   On: 08/08/2018 08:23    Procedures Procedures (including critical care  time)  Medications Ordered in ED Medications  acetaminophen (TYLENOL) tablet 650 mg (650 mg Oral Given 08/08/18 0724)  oxyCODONE (Oxy IR/ROXICODONE) immediate release tablet 5 mg (5 mg Oral Given 08/08/18 0724)  glyBURIDE (DIABETA) tablet 10 mg (10 mg Oral Given 08/08/18 0626)     Initial Impression / Assessment and Plan / ED Course  I have reviewed the triage vital signs and the nursing notes.  Pertinent labs & imaging results that were available during my care of the patient were reviewed by me and considered in my medical decision making (see chart for details).     DRAIDEN MIRSKY is a 48 year old male history of diabetes, Charcot foot who presents to the ED with increasing foot pain bilaterally, however patient with worsening pain in the right foot for the past 2 days.  States that this is started since increased activities at home as he has been working around the house cleaning more.  Denies any fever, chills.  Denies any ulcers.  Patient with overall well-appearing feet.  There is no major swelling or wounds to either foot.  Patient has strong pulses in both feet.  X-rays were obtained of the right foot that showed no acute fracture or malalignment.  Overall stable appearance of his right foot.  Patient was given Roxicodone and Tylenol for pain.  Recommend that he continue to use anti-inflammatories and pain medicine at home.  He does have orthopedic shoes at home and crutches.  Recommend he use those as well to allow his feet to rest.  Likely pain is secondary to increased activity.  May have a mild sprain as he thinks that he might of rolled his ankle.  Recommend follow-up with his foot and ankle doctor as well.  No concern for infectious process at this time.  Given return precautions.  Recommended that he continue to do wound care and wound checks at home.  Given return precautions.  This chart was dictated using voice recognition software.  Despite best efforts to proofread,  errors can  occur which can change the documentation meaning.    Final Clinical Impressions(s) / ED Diagnoses   Final diagnoses:  Foot pain, right    ED Discharge Orders         Ordered    glyBURIDE (DIABETA) 5 MG tablet  See admin instructions     08/08/18 0804    lidocaine (LIDODERM) 5 %  Every 24 hours     08/08/18 0804           Lennice Sites, DO 08/09/18 0750

## 2018-08-08 NOTE — ED Notes (Signed)
Patient transported to X-ray 

## 2018-08-08 NOTE — ED Triage Notes (Signed)
Pt c/o R foot pain and swelling for the past two days. Hx of DM, osteomyelitis, and sarcoidosis.

## 2018-08-31 ENCOUNTER — Ambulatory Visit: Payer: 59 | Admitting: Podiatry

## 2018-09-07 ENCOUNTER — Ambulatory Visit (INDEPENDENT_AMBULATORY_CARE_PROVIDER_SITE_OTHER): Payer: 59 | Admitting: Podiatry

## 2018-09-07 ENCOUNTER — Encounter: Payer: Self-pay | Admitting: Podiatry

## 2018-09-07 ENCOUNTER — Other Ambulatory Visit: Payer: Self-pay

## 2018-09-07 DIAGNOSIS — B351 Tinea unguium: Secondary | ICD-10-CM

## 2018-09-07 DIAGNOSIS — E11610 Type 2 diabetes mellitus with diabetic neuropathic arthropathy: Secondary | ICD-10-CM | POA: Diagnosis not present

## 2018-09-07 DIAGNOSIS — E1142 Type 2 diabetes mellitus with diabetic polyneuropathy: Secondary | ICD-10-CM

## 2018-09-07 DIAGNOSIS — M79676 Pain in unspecified toe(s): Secondary | ICD-10-CM

## 2018-09-07 NOTE — Progress Notes (Signed)
He presents today with a history of diabetes diabetic peripheral neuropathy and Charcot deformity.  He states that need to have the nails trimmed.  States that he continues to lose weight and obtains better control of his diabetes.  Objective: Vital signs are stable he is alert and oriented x3.  Pulses are palpable.  Much decrease in edema to the bilateral lower extremity no hot spots.  Toenails are long thick yellow dystrophic clinically mycotic.  Assessment: Diabetes mellitus diabetic peripheral neuropathy Charcot deformities and pain in limb secondary to onychomycosis.  Plan: Debridement of toenails 1 through 5 bilateral.  Follow-up with him in 2 months

## 2018-10-19 ENCOUNTER — Encounter: Payer: 59 | Admitting: Podiatry

## 2018-10-19 ENCOUNTER — Ambulatory Visit: Payer: 59

## 2018-10-19 DIAGNOSIS — E11610 Type 2 diabetes mellitus with diabetic neuropathic arthropathy: Secondary | ICD-10-CM

## 2018-10-19 NOTE — Progress Notes (Signed)
This encounter was created in error - please disregard.

## 2018-10-20 ENCOUNTER — Telehealth: Payer: Self-pay | Admitting: Internal Medicine

## 2018-10-20 NOTE — Telephone Encounter (Signed)
Copied from CRM [id number]. Topic: Quick Communication - Rx Refill/Question >> Oct 20, 2018  8:16 AM Reggie Pile, NT wrote: Medication:  glyBURIDE (DIABETA) 5 MG tablet   Has the patient contacted their pharmacy? Yes patient states there are no more refills and he has been out for going on a month. States it was prescribed by Dr.Burns 2019.   Preferred Pharmacy (with phone number or street name):  [facility] - [place], [place] - [address] [place] [phone] (Phone) [phone] (Fax)  Agent: Please be advised that RX refills may take up to 3 business days. We ask that you follow-up with your pharmacy.

## 2018-10-20 NOTE — Telephone Encounter (Signed)
Pt hasn't seen MD since 02/2017. Refill denied will need to make appt for refill.Marland KitchenRaechel Chute

## 2018-10-27 NOTE — Telephone Encounter (Signed)
Please advise what pt should do.

## 2018-10-27 NOTE — Telephone Encounter (Signed)
He needs to make an appt at [facility] [address] near the hospital - [phone] --  They treat patients w/o insurance.

## 2018-10-27 NOTE — Telephone Encounter (Signed)
Spoke with pt. Pt does not feel comfortable going to [facility]. Advised pt he could do self pay with a $80 copay and the rest would be billed at 55% discount through billing. Pt will think on this and call back to schedule appt with Dr Lawerance Bach.

## 2018-10-27 NOTE — Telephone Encounter (Signed)
Patient called and said that he would like to talk to someone in the office about getting his diabetic medication. Patient has not been working for 2 years and that is the reason he has not been able to see a provider. Please call patient back, thanks.

## 2018-11-09 ENCOUNTER — Ambulatory Visit (INDEPENDENT_AMBULATORY_CARE_PROVIDER_SITE_OTHER): Payer: 59

## 2018-11-09 ENCOUNTER — Encounter: Payer: Self-pay | Admitting: Podiatry

## 2018-11-09 ENCOUNTER — Ambulatory Visit (INDEPENDENT_AMBULATORY_CARE_PROVIDER_SITE_OTHER): Payer: 59 | Admitting: Podiatry

## 2018-11-09 ENCOUNTER — Other Ambulatory Visit: Payer: Self-pay

## 2018-11-09 VITALS — Temp 97.3°F

## 2018-11-09 DIAGNOSIS — E11610 Type 2 diabetes mellitus with diabetic neuropathic arthropathy: Secondary | ICD-10-CM | POA: Diagnosis not present

## 2018-11-09 DIAGNOSIS — M79676 Pain in unspecified toe(s): Secondary | ICD-10-CM

## 2018-11-09 DIAGNOSIS — B351 Tinea unguium: Secondary | ICD-10-CM | POA: Diagnosis not present

## 2018-11-10 ENCOUNTER — Encounter: Payer: Self-pay | Admitting: Podiatry

## 2018-11-10 NOTE — Progress Notes (Signed)
He presents today chief complaint of painful elongated toenails and swelling in bilateral lower extremity.  He also relates shortness of breath and restlessness at night.  States that his blood sugars been relatively good.  Denies any other change in his past medical history medications allergies.  History of Charcot arthropathy.  Objective: Vital signs are stable he is alert and oriented x3.  Pulses are palpable.  Neurologic sensorium is diminished deep tendon reflexes are intact muscle strength is normal symmetrical.  Little more swelling on the lateral left foot.  Radiographs taken today do not demonstrate any active Charcot arthropathy.  Toenails are long thick yellow dystrophic-like mycotic painful palpation.  No open lesions or wounds.  Assessment: Pain in limb secondary to onychomycosis and Charcot arthropathy.  Diabetic peripheral neuropathy and pes planus.  Plan: Recommended that he follow-up with primary for cardiac work-up.  Also recommended debridement debrided nails 1 through 5 bilaterally.  He will follow-up with me in a regular 2 months

## 2018-11-16 ENCOUNTER — Telehealth: Payer: Self-pay

## 2018-11-16 NOTE — Telephone Encounter (Signed)
Received dc from [facility].  Patient is a patient of [facility].   DC will be forwared to them via courier.

## 2018-11-22 DEATH — deceased

## 2019-01-11 ENCOUNTER — Ambulatory Visit: Payer: 59 | Admitting: Podiatry

## 2021-03-21 NOTE — Telephone Encounter (Signed)
error
